# Patient Record
Sex: Male | Born: 1965 | Race: Black or African American | Hispanic: No | Marital: Married | Smoking: Current every day smoker
Health system: Southern US, Community
[De-identification: ages and names within clinical notes are randomized; demographics above are authoritative.]

## PROBLEM LIST (undated history)

## (undated) DIAGNOSIS — Z8739 Personal history of other diseases of the musculoskeletal system and connective tissue: Secondary | ICD-10-CM

## (undated) HISTORY — PX: HERNIA REPAIR: SHX51

---

## 2013-04-03 ENCOUNTER — Emergency Department: Payer: Self-pay | Admitting: Emergency Medicine

## 2013-04-03 LAB — CBC
HCT: 39.4 % — ABNORMAL LOW (ref 40.0–52.0)
HGB: 13.8 g/dL (ref 13.0–18.0)
MCH: 31.9 pg (ref 26.0–34.0)
MCV: 91 fL (ref 80–100)
Platelet: 213 10*3/uL (ref 150–440)
RDW: 13 % (ref 11.5–14.5)

## 2013-04-03 LAB — COMPREHENSIVE METABOLIC PANEL
Albumin: 3.9 g/dL (ref 3.4–5.0)
Alkaline Phosphatase: 74 U/L (ref 50–136)
BUN: 12 mg/dL (ref 7–18)
Bilirubin,Total: 0.4 mg/dL (ref 0.2–1.0)
Calcium, Total: 8.8 mg/dL (ref 8.5–10.1)
Chloride: 106 mmol/L (ref 98–107)
Co2: 26 mmol/L (ref 21–32)
Creatinine: 0.95 mg/dL (ref 0.60–1.30)
EGFR (Non-African Amer.): >60
Glucose: 102 mg/dL — ABNORMAL HIGH (ref 65–99)
Potassium: 3.6 mmol/L (ref 3.5–5.1)
SGPT (ALT): 23 U/L (ref 12–78)
Total Protein: 8.3 g/dL — ABNORMAL HIGH (ref 6.4–8.2)

## 2013-05-02 ENCOUNTER — Ambulatory Visit: Payer: Self-pay | Admitting: Surgery

## 2013-05-02 LAB — BASIC METABOLIC PANEL
Anion Gap: 3 — ABNORMAL LOW (ref 7–16)
BUN: 13 mg/dL (ref 7–18)
Calcium, Total: 8.9 mg/dL (ref 8.5–10.1)
Chloride: 107 mmol/L (ref 98–107)
Co2: 29 mmol/L (ref 21–32)
Creatinine: 0.86 mg/dL (ref 0.60–1.30)
EGFR (African American): >60
EGFR (Non-African Amer.): >60
Osmolality: 277 (ref 275–301)
Potassium: 3.9 mmol/L (ref 3.5–5.1)
Sodium: 139 mmol/L (ref 136–145)

## 2013-05-02 LAB — APTT: Activated PTT: 35.4 secs (ref 23.6–35.9)

## 2013-05-02 LAB — HEPATIC FUNCTION PANEL A (ARMC)
Alkaline Phosphatase: 67 U/L (ref 50–136)
Bilirubin, Direct: <0.1 mg/dL (ref 0.00–0.20)
Bilirubin,Total: 0.4 mg/dL (ref 0.2–1.0)
Total Protein: 8 g/dL (ref 6.4–8.2)

## 2013-05-02 LAB — CBC WITH DIFFERENTIAL/PLATELET
Basophil #: 0 10*3/uL (ref 0.0–0.1)
Eosinophil #: 0.1 10*3/uL (ref 0.0–0.7)
Eosinophil %: 2.4 %
Lymphocyte #: 1.4 10*3/uL (ref 1.0–3.6)
MCH: 31.3 pg (ref 26.0–34.0)
Monocyte #: 0.5 x10 3/mm (ref 0.2–1.0)
Monocyte %: 8.7 %
Neutrophil #: 3.6 10*3/uL (ref 1.4–6.5)
Neutrophil %: 64.1 %
Platelet: 231 10*3/uL (ref 150–440)
RDW: 13.3 % (ref 11.5–14.5)

## 2013-05-10 ENCOUNTER — Inpatient Hospital Stay: Payer: Self-pay | Admitting: Surgery

## 2013-05-11 LAB — CBC WITH DIFFERENTIAL/PLATELET
Basophil #: 0 10*3/uL (ref 0.0–0.1)
Basophil %: 0.2 %
Eosinophil #: 0 10*3/uL (ref 0.0–0.7)
HCT: 39.5 % — ABNORMAL LOW (ref 40.0–52.0)
Lymphocyte #: 0.6 10*3/uL — ABNORMAL LOW (ref 1.0–3.6)
Lymphocyte %: 5.2 %
Monocyte #: 1.3 x10 3/mm — ABNORMAL HIGH (ref 0.2–1.0)
Neutrophil #: 10.1 10*3/uL — ABNORMAL HIGH (ref 1.4–6.5)
Platelet: 219 10*3/uL (ref 150–440)
RBC: 4.26 10*6/uL — ABNORMAL LOW (ref 4.40–5.90)
RDW: 13.2 % (ref 11.5–14.5)
WBC: 12.1 10*3/uL — ABNORMAL HIGH (ref 3.8–10.6)

## 2013-05-11 LAB — COMPREHENSIVE METABOLIC PANEL
Alkaline Phosphatase: 49 U/L — ABNORMAL LOW (ref 50–136)
Bilirubin,Total: 0.5 mg/dL (ref 0.2–1.0)
Chloride: 105 mmol/L (ref 98–107)
Creatinine: 1.09 mg/dL (ref 0.60–1.30)
EGFR (African American): >60
Glucose: 140 mg/dL — ABNORMAL HIGH (ref 65–99)
Osmolality: 274 (ref 275–301)
Potassium: 5 mmol/L (ref 3.5–5.1)
SGOT(AST): 27 U/L (ref 15–37)
SGPT (ALT): 26 U/L (ref 12–78)
Sodium: 136 mmol/L (ref 136–145)
Total Protein: 7.2 g/dL (ref 6.4–8.2)

## 2014-12-14 NOTE — Op Note (Signed)
PATIENT NAME:  Carl Washington, Carl Washington MR#:  [id number] DATE OF BIRTH:  May 30, 1966  DATE OF PROCEDURE:  05/10/2013  OPERATION PERFORMED:   1.  Laparotomy.  2.  Partial omentectomy.  3.  Left orchiectomy.  4.  Incarcerated left inguinal hernia repair with mesh.   SURGEON: Claude MangesWilliam F Onyx Schirmer, M.D.   FIRST ASSISTANT: Dr. Westley Gamblesim Oaks.   ANESTHESIA: General.   PREOPERATIVE DIAGNOSIS: Huge incarcerated left inguinal hernia.   POSTOPERATIVE DIAGNOSIS: Huge incarcerated left inguinal hernia. (Sliding-entire sigmoid colon.)   PROCEDURE IN DETAIL: The patient was prepped and draped in the usual sterile fashion  in the supine position and a lower midline incision was made and carried just above the umbilicus and down through the subcutaneous tissue and the linea alba with electrocautery. The peritoneum was entered carefully. The patient was noted to have omentum that was incarcerated the left inguinal hernia sac and this could not be reduced and therefore, a longitudinal incision was made in the left hemiscrotum and this was carried down through the multiple layers of scar tissue and dartos fascia and 3 liters of hydrocele fluid was evacuated. Even with this exposure on both sides of the hernia defect, the omentum could not be reduced as it was wadded up and scarred down in the scrotum. Therefore, an omentectomy was performed with a combination of the Harmonic scalpel and 2-0 silk ligatures from the tip of the transverse colon which was essentially at the internal inguinal ring. Once this had been performed, the omentum was removed from within the scrotum. This left the patient with the entire sigmoid colon as a sliding component hernia down in the hernia sac and it was quite long and bulbous. Therefore, the entire hernia sac was excised.  The gubernaculum was divided. The left testicle and spermatic cord were elevated in the wound and ultimately dissected all the way up to the neck of the hernia sac. The portion of  the hernia sac that was next to the sigmoid colon was left attached to the sigmoid colon and a basically retroperitoneal dissection was performed within the scrotum such that the entire sigmoid mesocolon could be freed up to the inguinal ring. Inguinal ring was quite large but despite this I could not reduce the sigmoid colon. Therefore, I divided the left inguinal ligament and following that with bimanual manipulation I was able to reduce the sigmoid colon into the peritoneal cavity. After this, orchiectomy was performed by dividing the spermatic cord distal to suture ligature of 2-0 Vicryl and a double ligature of 2-0 Vicryl and this left the left hemiscrotum with a tremendous amount of raw surface area that some of which was oozing. This was all controlled with the argon beam coagulating device and a small incision was made in the dependent area of the left hemiscrotum and a 1/2 inch Penrose drain was brought through this into the scrotal cavity and secured to the skin with a 2-0 nylon suture. Attention was then turned to the peritoneal cavity where the hernia repair was undertaken first with an 11 x 14 Kugel Hernia Patch was placed in a preperitoneal or retroperitoneal position and secured in place with 2 horizontal mattress sutures of 2-0 Prolene. One of these was to the bilious psoas muscle and the other to the pubis. Despite this, the hernia defect was not closed adequately (dorsally) and therefore an additional 11 x 14 Ventrio Hernia Patch was placed in the peritoneal cavity, down below to the previously placed Kugel. This was also sutured in  place but just sutured to the peritoneum in order to keep the Ventrio Hernia Patch from holding back on itself and allowing any of the polypropylene to be exposed to the peritoneal cavity. This in fact, adequately closed the hernia defect and the sigmoid colon and mesocolon were replaced in their anatomic position and the small intestine was replaced in its anatomic  position and the transverse colon and mesocolon were draped over top of the small bowel and the linea alba was closed with a running #1 PDS suture. The subcutaneous tissue was irrigated and the skin was reapproximated with a skin stapling device. The left hemiscrotum was closed in 3 layers each with 3-0 Monocryl including the very thick-walled scar tissue that had been surrounding the hernia sac and dartos fascia and the skin. The patient tolerated the procedure well. There were no complications.     ____________________________ Claude Manges, MD wfm:dp D: 05/10/2013 14:48:38 ET T: 05/10/2013 15:59:51 ET JOB#: [id number]  cc: Claude Manges, MD, <Dictator> Claude Manges MD ELECTRONICALLY SIGNED 05/12/2013 18:01

## 2014-12-14 NOTE — Discharge Summary (Signed)
PATIENT NAME:  Carl [place], [place] MR#:  [id number] DATE OF BIRTH:  10-20-1965  DATE OF ADMISSION:  05/10/2013 DATE OF DISCHARGE:  05/13/2013   PRINCIPAL DIAGNOSIS: Huge incarcerated left inguinal hernia (sliding type of entire sigmoid colon).   OTHER DIAGNOSIS: Morbid obesity.   PRINCIPAL PROCEDURE PERFORMED DURING THIS ADMISSION: Laparotomy, partial omentectomy, left orchiectomy, incarcerated left inguinal hernia repair with mesh.   HOSPITAL COURSE: The patient was admitted to the hospital following the above-mentioned procedure for the above-mentioned diagnosis, and his pain slowly improved to where it was controlled with Tylox. He was eating, ambulating and having bowel movements. I removed his Penrose drain on the day of discharge, and he still had a huge scrotum due to the thickness of the wall and the chronicity of the disease. There was no appreciable change in his scrotum from directly postoperative, and therefore I do not believe that he had any significant hematoma or fluid collection. I instructed him and his wife to make sure his scrotum remains supported and that they would have to modify some plain underwear or perhaps boxer shorts, as it was still much larger than would fit into any kind of scrotal support system. I asked him to return to see me in 6 days and to call the office in the interim for fever or any other problems. He was discharged home with Tylox and a full month of ciprofloxacin.   ____________________________ Claude MangesWilliam F. Sinclair Arrazola, MD wfm:OSi D: 05/13/2013 09:07:27 ET T: 05/13/2013 09:47:03 ET JOB#: [id number]  cc: Claude MangesWilliam F. Shmuel Girgis, MD, <Dictator> Claude MangesWILLIAM F Aliannah Holstrom MD ELECTRONICALLY SIGNED 05/13/2013 16:41

## 2017-12-28 ENCOUNTER — Encounter: Payer: Self-pay | Admitting: Emergency Medicine

## 2017-12-28 ENCOUNTER — Other Ambulatory Visit: Payer: Self-pay

## 2017-12-28 ENCOUNTER — Ambulatory Visit
Admission: EM | Admit: 2017-12-28 | Discharge: 2017-12-28 | Disposition: A | Discharge status: Home / Self Care | Payer: BLUE CROSS/BLUE SHIELD | Attending: Family Medicine | Admitting: Family Medicine

## 2017-12-28 ENCOUNTER — Ambulatory Visit (INDEPENDENT_AMBULATORY_CARE_PROVIDER_SITE_OTHER): Payer: BLUE CROSS/BLUE SHIELD

## 2017-12-28 DIAGNOSIS — M778 Other enthesopathies, not elsewhere classified: Secondary | ICD-10-CM

## 2017-12-28 DIAGNOSIS — M779 Enthesopathy, unspecified: Secondary | ICD-10-CM | POA: Diagnosis not present

## 2017-12-28 DIAGNOSIS — M25532 Pain in left wrist: Secondary | ICD-10-CM

## 2017-12-28 MED ORDER — PREDNISONE 20 MG PO TABS: ORAL_TABLET | ORAL | 0 refills | Status: DC

## 2017-12-28 MED ORDER — HYDROCODONE-ACETAMINOPHEN 5-325 MG PO TABS: ORAL_TABLET | ORAL | 0 refills | Status: DC

## 2017-12-28 MED ORDER — ACETAMINOPHEN 500 MG PO TABS
1000.0000 mg | ORAL_TABLET | Freq: Once | ORAL | Status: AC
  Administered 2017-12-28: 1000 mg via ORAL

## 2017-12-28 NOTE — ED Provider Notes (Signed)
[facility]    CSN: [id number] Arrival date & time: 12/28/17  4098     History   Chief Complaint Chief Complaint  Patient presents with  . Wrist Pain    left    HPI Carl Washington is a 52 y.o. male.   52 yo male with a c/o right wrist pain and swelling for 2 days. Denies any direct trauma or injuries, fevers, chills, rash.   The history is provided by the patient.  Wrist Pain     History reviewed. No pertinent past medical history.  There are no active problems to display for this patient.   Past Surgical History:  Procedure Laterality Date  . HERNIA REPAIR         Home Medications    Prior to Admission medications   Medication Sig Start Date End Date Taking? Authorizing Provider  HYDROcodone-acetaminophen (NORCO/VICODIN) 5-325 MG tablet 1-2 tabs po qd prn 12/28/17   Payton Mccallum, MD  predniSONE (DELTASONE) 20 MG tablet 3 tabs po once day 1, then 2 tabs po qd x 2 days, then 1 tab po qd x 2 days, then half a tab po qd x 2 days 12/28/17   Payton Mccallum, MD    Family History History reviewed. No pertinent family history.  Social History Social History   Tobacco Use  . Smoking status: Current Every Day Smoker    Types: Cigarettes  . Smokeless tobacco: Never Used  Substance Use Topics  . Alcohol use: Never    Frequency: Never  . Drug use: Not on file     Allergies   Patient has no known allergies.   Review of Systems Review of Systems   Physical Exam Triage Vital Signs ED Triage Vitals  Enc Vitals Group     BP 12/28/17 0900 (S) (!) 174/84     Pulse Rate 12/28/17 0900 74     Resp 12/28/17 0900 16     Temp 12/28/17 0900 98.4 F (36.9 C)     Temp Source 12/28/17 0900 Oral     SpO2 12/28/17 0900 97 %     Weight 12/28/17 0857 255 lb (115.7 kg)     Height 12/28/17 0857  (1.803 m)     Head Circumference --      Peak Flow --      Pain Score 12/28/17 0857 8     Pain Loc --      Pain Edu? --      Excl. in GC? --    No  data found.  Updated Vital Signs BP (S) (!) 174/84 (BP Location: Right Arm)   Pulse 74   Temp 98.4 F (36.9 C) (Oral)   Resp 16   Ht  (1.803 m)   Wt 255 lb (115.7 kg)   SpO2 97%   BMI 35.57 kg/m   Visual Acuity Right Eye Distance:   Left Eye Distance:   Bilateral Distance:    Right Eye Near:   Left Eye Near:    Bilateral Near:     Physical Exam  Constitutional: He appears well-developed and well-nourished. No distress.  Musculoskeletal:       Left wrist: He exhibits decreased range of motion, tenderness, bony tenderness and swelling. He exhibits no effusion, no crepitus, no deformity and no laceration.  Skin: He is not diaphoretic.  Nursing note and vitals reviewed.    UC Treatments / Results  Labs (all labs ordered are listed, but only abnormal results are displayed) Labs  Reviewed - No data to display  EKG None  Radiology Dg Wrist Complete Left  Result Date: 12/28/2017 CLINICAL DATA:  Atraumatic swelling pain at the left wrist. EXAM: LEFT WRIST - COMPLETE 3+ VIEW COMPARISON:  None. FINDINGS: Soft tissue swelling about the wrist without fracture, malalignment, erosion, joint narrowing or soft tissue calcification. IMPRESSION: Soft tissue swelling without osseous abnormality. Electronically Signed   By: Marnee Spring M.D.   On: 12/28/2017 10:39    Procedures Procedures (including critical care time)  Medications Ordered in UC Medications  acetaminophen (TYLENOL) tablet 1,000 mg (1,000 mg Oral Given 12/28/17 0943)    Initial Impression / Assessment and Plan / UC Course  I have reviewed the triage vital signs and the nursing notes.  Pertinent labs & imaging results that were available during my care of the patient were reviewed by me and considered in my medical decision making (see chart for details).      Final Clinical Impressions(s) / UC Diagnoses   Final diagnoses:  Left wrist pain  Left wrist tendonitis     Discharge Instructions       Ice, rest    ED Prescriptions    Medication Sig Dispense Auth. Provider   predniSONE (DELTASONE) 20 MG tablet 3 tabs po once day 1, then 2 tabs po qd x 2 days, then 1 tab po qd x 2 days, then half a tab po qd x 2 days 10 tablet Payton Mccallum, MD   HYDROcodone-acetaminophen (NORCO/VICODIN) 5-325 MG tablet 1-2 tabs po qd prn 6 tablet Shamiah Kahler, Pamala Hurry, MD     1. x-ray results and diagnosis reviewed with patient 2. rx as per orders above; reviewed possible side effects, interactions, risks and benefits  3. Recommend supportive treatment with ice, rest 4. Follow-up prn if symptoms worsen or Washington't improve     Controlled Substance Prescriptions [place] Controlled Substance Registry consulted? Not Applicable   Payton Mccallum, MD 12/28/17 1126

## 2017-12-28 NOTE — Discharge Instructions (Signed)
Ice, rest

## 2017-12-28 NOTE — ED Triage Notes (Signed)
Patient c/o left wrist pain and swelling that started yesterday.  Patient denies injury or fall.

## 2017-12-30 ENCOUNTER — Other Ambulatory Visit: Payer: Self-pay

## 2017-12-30 ENCOUNTER — Encounter: Payer: Self-pay | Admitting: Emergency Medicine

## 2017-12-30 ENCOUNTER — Ambulatory Visit
Admission: EM | Admit: 2017-12-30 | Discharge: 2017-12-30 | Disposition: A | Discharge status: Home / Self Care | Payer: BLUE CROSS/BLUE SHIELD | Attending: Family Medicine | Admitting: Family Medicine

## 2017-12-30 DIAGNOSIS — M25532 Pain in left wrist: Secondary | ICD-10-CM

## 2017-12-30 MED ORDER — COLCHICINE 0.6 MG PO TABS: 0.6000 mg | ORAL_TABLET | Freq: Two times a day (BID) | ORAL | 0 refills | Status: DC

## 2017-12-30 NOTE — Discharge Instructions (Signed)
Suspect gout.  Continue the prednisone. Take the colchicine as well.  Take care  Dr. Adriana Simas

## 2017-12-30 NOTE — ED Triage Notes (Signed)
Patient states he is not much better. States he has been icing it and taking the medicine but it is still painful and swollen

## 2017-12-30 NOTE — ED Provider Notes (Signed)
[facility]    CSN: [id number] Arrival date & time: 12/30/17  1531  History   Chief Complaint Chief Complaint  Patient presents with  . Joint Swelling    HPI   52 year old male presents with continued left wrist pain and swelling.  Patient states that this started on the last day that he was seen which was 5/7.  He had an x-ray done which was negative.  He states that he continues to have swelling and pain as well as decreased range of motion.  He states that is slightly improved.  He is currently taking prednisone and is using pain medication as needed.  No reports of fall, trauma, injury.  Patient's primary concern is his return to work.  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  Worse with range of motion.  No other associated symptoms.  No other complaints or concerns at this time.  Social History Social History   Tobacco Use  . Smoking status: Current Every Day Smoker    Types: Cigarettes  . Smokeless tobacco: Never Used  Substance Use Topics  . Alcohol use: Never    Frequency: Never  . Drug use: Not on file   Allergies   Patient has no known allergies.  Review of Systems Review of Systems  Constitutional: Negative.   Musculoskeletal:       Wrist pain and swelling.   Physical Exam Triage Vital Signs ED Triage Vitals  Enc Vitals Group     BP 12/30/17 1551 (!) 150/86     Pulse Rate 12/30/17 1551 83     Resp 12/30/17 1551 16     Temp 12/30/17 1551 98 F (36.7 C)     Temp src --      SpO2 12/30/17 1551 99 %     Weight 12/30/17 1550 255 lb (115.7 kg)     Height 12/30/17 1550  (1.803 m)     Head Circumference --      Peak Flow --      Pain Score 12/30/17 1549 7     Pain Loc --      Pain Edu? --      Excl. in GC? --    Updated Vital Signs BP (!) 150/86 (BP Location: Left Arm)   Pulse 83   Temp 98 F (36.7 C)   Resp 16   Ht   (1.803 m)   Wt 255 lb (115.7 kg)   SpO2 99%   BMI 35.57 kg/m    Physical Exam  Constitutional: He is oriented to person, place, and time. He appears well-developed. No distress.  HENT:  Head: Normocephalic and atraumatic.  Pulmonary/Chest: Effort normal. No respiratory distress.  Musculoskeletal:  Left wrist -warmth.  No appreciable erythema.  Exquisitely tender to palpation.  Markedly decreased range of motion in all planes.  Neurological: He is alert and oriented to person, place, and time.  Psychiatric: He has a normal mood and affect. His behavior is normal.  Nursing note and vitals reviewed.  UC Treatments / Results  Labs (all labs ordered are listed, but only abnormal results are displayed) Labs Reviewed - No data to display  EKG None  Radiology No results found.  Procedures Procedures (including critical care time)  Medications Ordered in UC Medications - No data to display  Initial Impression / Assessment and  Plan / UC Course  I have reviewed the triage vital signs and the nursing notes.  Pertinent labs & imaging results that were available during my care of the patient were reviewed by me and considered in my medical decision making (see chart for details).    52 year old male presents with continued wrist pain.  Work note given.  I suspect that this is gout.  Continue prednisone.  Adding colchicine.  Final Clinical Impressions(s) / UC Diagnoses   Final diagnoses:  Left wrist pain     Discharge Instructions     Suspect gout.  Continue the prednisone. Take the colchicine as well.  Take care  Dr. Adriana Simas    ED Prescriptions    Medication Sig Dispense Auth. Provider   colchicine 0.6 MG tablet Take 1 tablet (0.6 mg total) by mouth 2 (two) times daily. 20 tablet Tommie Sams, DO     Controlled Substance Prescriptions [place] Controlled Substance Registry consulted? Not Applicable   Tommie Sams, DO 12/30/17 1643

## 2020-01-30 ENCOUNTER — Ambulatory Visit
Admission: EM | Admit: 2020-01-30 | Discharge: 2020-01-30 | Disposition: A | Discharge status: Home / Self Care | Payer: BC Managed Care – PPO | Attending: Emergency Medicine | Admitting: Emergency Medicine

## 2020-01-30 ENCOUNTER — Encounter: Payer: Self-pay | Admitting: Emergency Medicine

## 2020-01-30 ENCOUNTER — Other Ambulatory Visit: Payer: Self-pay

## 2020-01-30 DIAGNOSIS — S63502A Unspecified sprain of left wrist, initial encounter: Secondary | ICD-10-CM | POA: Diagnosis not present

## 2020-01-30 DIAGNOSIS — M109 Gout, unspecified: Secondary | ICD-10-CM

## 2020-01-30 HISTORY — DX: Personal history of other diseases of the musculoskeletal system and connective tissue: Z87.39

## 2020-01-30 MED ORDER — INDOMETHACIN 25 MG PO CAPS
50.0000 mg | ORAL_CAPSULE | Freq: Three times a day (TID) | ORAL | 0 refills | Status: AC | PRN
Start: 2020-01-30 — End: 2020-02-04

## 2020-01-30 MED ORDER — PREDNISONE 20 MG PO TABS
40.0000 mg | ORAL_TABLET | Freq: Every day | ORAL | 0 refills | Status: AC
Start: 2020-01-30 — End: 2020-02-04

## 2020-01-30 MED ORDER — COLCHICINE 0.6 MG PO TABS
ORAL_TABLET | ORAL | 0 refills | Status: DC
Start: 2020-01-30 — End: 2020-06-17

## 2020-01-30 MED ORDER — INDOMETHACIN 25 MG PO CAPS
25.0000 mg | ORAL_CAPSULE | Freq: Three times a day (TID) | ORAL | 0 refills | Status: DC | PRN
Start: 2020-01-30 — End: 2020-01-30

## 2020-01-30 NOTE — ED Provider Notes (Signed)
HPI  SUBJECTIVE:  Carl Washington is a left-handed 54 y.o. male who presents with throbbing constant left wrist pain, swelling starting yesterday.  He reports hypersensitivity to touch for the past few days.  No body aches, fevers, joint erythema.  No trauma to the wrist.  No numbness or tingling in his fingers.  States that his hand, forearm and elbow are without injury.  No recent red meat, seafood ingestion.  He tried aspirin without improvement in his symptoms.  No alleviating factors.  Symptoms worse with use, palpation.  He took aspirin within 4 to 6 hours of evaluation.  He states this is identical to gout which she gets in this joint.  States that he was seen here and given a wrist brace which helped.  He has a past medical history of gout in the left wrist, he is not on any preventative medications.  No history of diabetes, hypertension, kidney disease, carpal tunnel syndrome, history of injury to the left wrist, septic joint.  PMD: None.  Patient was seen here twice in 2019 for an identical presentation, thought to have gout.  Was sent home with prednisone, Norco for the first visit and colchicine on the second visit.  Past Medical History:  Diagnosis Date  . Personal history of gout     Past Surgical History:  Procedure Laterality Date  . HERNIA REPAIR      Family History  Problem Relation Age of Onset  . Diabetes Mother   . Other Father        unknown medical history    Social History   Tobacco Use  . Smoking status: Current Every Day Smoker    Packs/day: 0.75    Years: 2.00    Pack years: 1.50    Types: Cigarettes  . Smokeless tobacco: Never Used  Substance Use Topics  . Alcohol use: Yes    Comment: social  . Drug use: Never    No current facility-administered medications for this encounter.  Current Outpatient Medications:  .  colchicine 0.6 MG tablet, 2 tabs po x 1, then one tab po 1 hour later, Disp: 6 tablet, Rfl: 0 .  indomethacin (INDOCIN) 25 MG  capsule, Take 2 capsules (50 mg total) by mouth 3 (three) times daily as needed for up to 5 days., Disp: 30 capsule, Rfl: 0 .  predniSONE (DELTASONE) 20 MG tablet, Take 2 tablets (40 mg total) by mouth daily with breakfast for 5 days., Disp: 10 tablet, Rfl: 0  No Known Allergies   ROS  As noted in HPI.   Physical Exam  BP (!) 168/81 (BP Location: Right Arm)   Pulse 83   Temp 98.2 F (36.8 C) (Oral)   Resp 18   Ht 5\' 11"  (1.803 m)   Wt 127 kg   SpO2 100%   BMI 39.05 kg/m   Constitutional: Well developed, well nourished, no acute distress Eyes:  EOMI, conjunctiva normal bilaterally HENT: Normocephalic, atraumatic,mucus membranes moist Respiratory: Normal inspiratory effort Cardiovascular: Normal rate GI: nondistended skin: No rash, skin intact Musculoskeletal: Positive tender swelling left wrist.  No erythema, increased temperature.  L  distal radius NT, distal ulnar styloid NT, snuffbox NT, carpals NT , metacarpals NT, digits NT, TFCC NT .  pain  with supination,  Pain with pronation, pain  with wrist flexion extension, radial / ulnar deviation. Motor intact ability to flex / extend digits of affected hand, Sensation LT to hand normal, CR<2 seconds distally, Elbow and proximal forearm NT.  Neurologic: Alert & oriented x 3, no focal neuro deficits Psychiatric: Speech and behavior appropriate   ED Course   Medications - No data to display  Orders Placed This Encounter  Procedures  . Apply Wrist brace    Standing Status:   Standing    Number of Occurrences:   1    Order Specific Question:   Laterality    Answer:   Left    No results found for this or any previous visit (from the past 24 hour(s)). No results found.  ED Clinical Impression  1. Acute gout of left wrist, unspecified cause      ED Assessment/Plan  Previous records reviewed.  As noted in HPI.  Suspect recurrent gout.  Doubt septic joint.  Patient states that this is identical to  previous flares of gout.  He denies trauma, so imaging was deferred.  Home with indomethacin, colchicine, prednisone.  Patient states the left wrist brace helped him last time this happened, will so we will send him home with another one.  Work note for 2 days.  Primary care list for ongoing management.  Blood pressure noted.  Discussed with patient that he needs to have this monitored.  Discussed MDM, treatment plan, and plan for follow-up with patient. Discussed sn/sx that should prompt return to the ED. patient agrees with plan.   Meds ordered this encounter  Medications  . colchicine 0.6 MG tablet    Sig: 2 tabs po x 1, then one tab po 1 hour later    Dispense:  6 tablet    Refill:  0  . predniSONE (DELTASONE) 20 MG tablet    Sig: Take 2 tablets (40 mg total) by mouth daily with breakfast for 5 days.    Dispense:  10 tablet    Refill:  0  . DISCONTD: indomethacin (INDOCIN) 25 MG capsule    Sig: Take 1 capsule (25 mg total) by mouth 3 (three) times daily as needed for up to 5 days.    Dispense:  15 capsule    Refill:  0  . indomethacin (INDOCIN) 25 MG capsule    Sig: Take 2 capsules (50 mg total) by mouth 3 (three) times daily as needed for up to 5 days.    Dispense:  30 capsule    Refill:  0    *This clinic note was created using [employer]. Therefore, there may be occasional mistakes despite careful proofreading.   ?    Melynda Ripple, MD 01/30/20 1052

## 2020-01-30 NOTE — Discharge Instructions (Signed)
Here is a list of primary care providers who are taking new patients:  Dr. Elizabeth Sauer [address] [place] [address] [phone]  [facility] [address] [place] [address]  [phone]  [facility] [address] [place], [place] [address] [phone]  [facility] [address] [place]  [phone] [place], [place] [address]  Here are clinics/ other resources who will see you if you do not have insurance. Some have certain criteria that you must meet. Call them and find out what they are:  [facility]: [address] [place], [place] [address] Phone: [phone] Hours: First and Third Saturdays of each Month, 9 a.m. - 1 p.m.  [facility]: [address], [address] [place], [place] [address] Phone: [phone] Hours: Tuesday, 4 p.m. - 8 p.m. Thursday, 1 p.m. - 8 p.m. Wednesday, 9 a.m. - [facility] [address] [place], [place] [address] Phone: [phone] Pharmacy Phone Number: [phone] Dental Phone Number: [phone] [facility] Insurance Help: [phone]  Dental Hours: Monday - Thursday, 8 a.m. - 6 p.m.  [facility] [address] [place], [place] [address] Phone: [phone] Pharmacy Phone Number: [phone] [facility] Insurance Help: [phone]  [facility] [address] [place] [place], [place] [address] Phone: [phone] Pharmacy Phone Number: [phone] [facility] Insurance Help: [phone]  [facility] [address] [place], [place] [address] Phone: [phone] [facility] Insurance Help: [phone]   [facility] [address], [place], [place] [address] Phone: [phone]  Go to www.goodrx.com to look up your medications. This will give you a list of where you can find your prescriptions at the most affordable prices. Or ask the pharmacist what the cash price is, or if they have  any other discount programs available to help make your medication more affordable. This can be less expensive than what you would pay with insurance.    Go to www.goodrx.com to look up your medications. This will give you a list of where you can find your prescriptions at the most affordable prices.   Dietary treatment plays a supplementary role in treatment of gout.  Diet can be expected to decrease the uric acid level by 10 to 15%.  Often medication can effect a much more substantial reduction.  An extremely restrictive diet is not necessary, but here are a few suggestions that might help decrease the frequency of gout attacks.  The first and most important measure is to achieve and maintain a healthy body weight (BMI of 20 to 25).  It's been shown that people with a BMI over 25 have an increased risk of gout attacks compared to people with a BMI of less than 25.  Also, gout patients who lose as little as 4.5 kg or 9.9 lbs will decrease their risk of gout attacks.  Beyond that, here are a few general guidelines:  Eat less: Red meat Seafood Beer and hard alcohol (e.g. gin, vodka, whiskey) Foods that contain high fructose corn syrup (found in sweets and non-diet sodas) Organ meats (liver, kidneys, brains, sweetbreads) or foods made from these meats (hot dogs, bologna).  Eat more: Low fat dairy products A moderate amount of wine (up to two 5 oz servings per day) is not likely to increase the risk of a gout attack. Coffee may decrease the risk of gout attacks Vitamin C (500 mg per day) has a mild urate lowering effect

## 2020-01-30 NOTE — ED Triage Notes (Signed)
Patient in today c/o left wrist pain and swelling x yesterday. Patient denies any injury. Patient does have a history of gout.

## 2020-02-20 IMAGING — CR DG WRIST COMPLETE 3+V*L*
4 series · 4 of 4 positions shown · non-contrast
Comparison: None.

CLINICAL DATA: Atraumatic swelling pain at the left wrist.

EXAM:
LEFT WRIST - COMPLETE 3+ VIEW

[wrist pa]
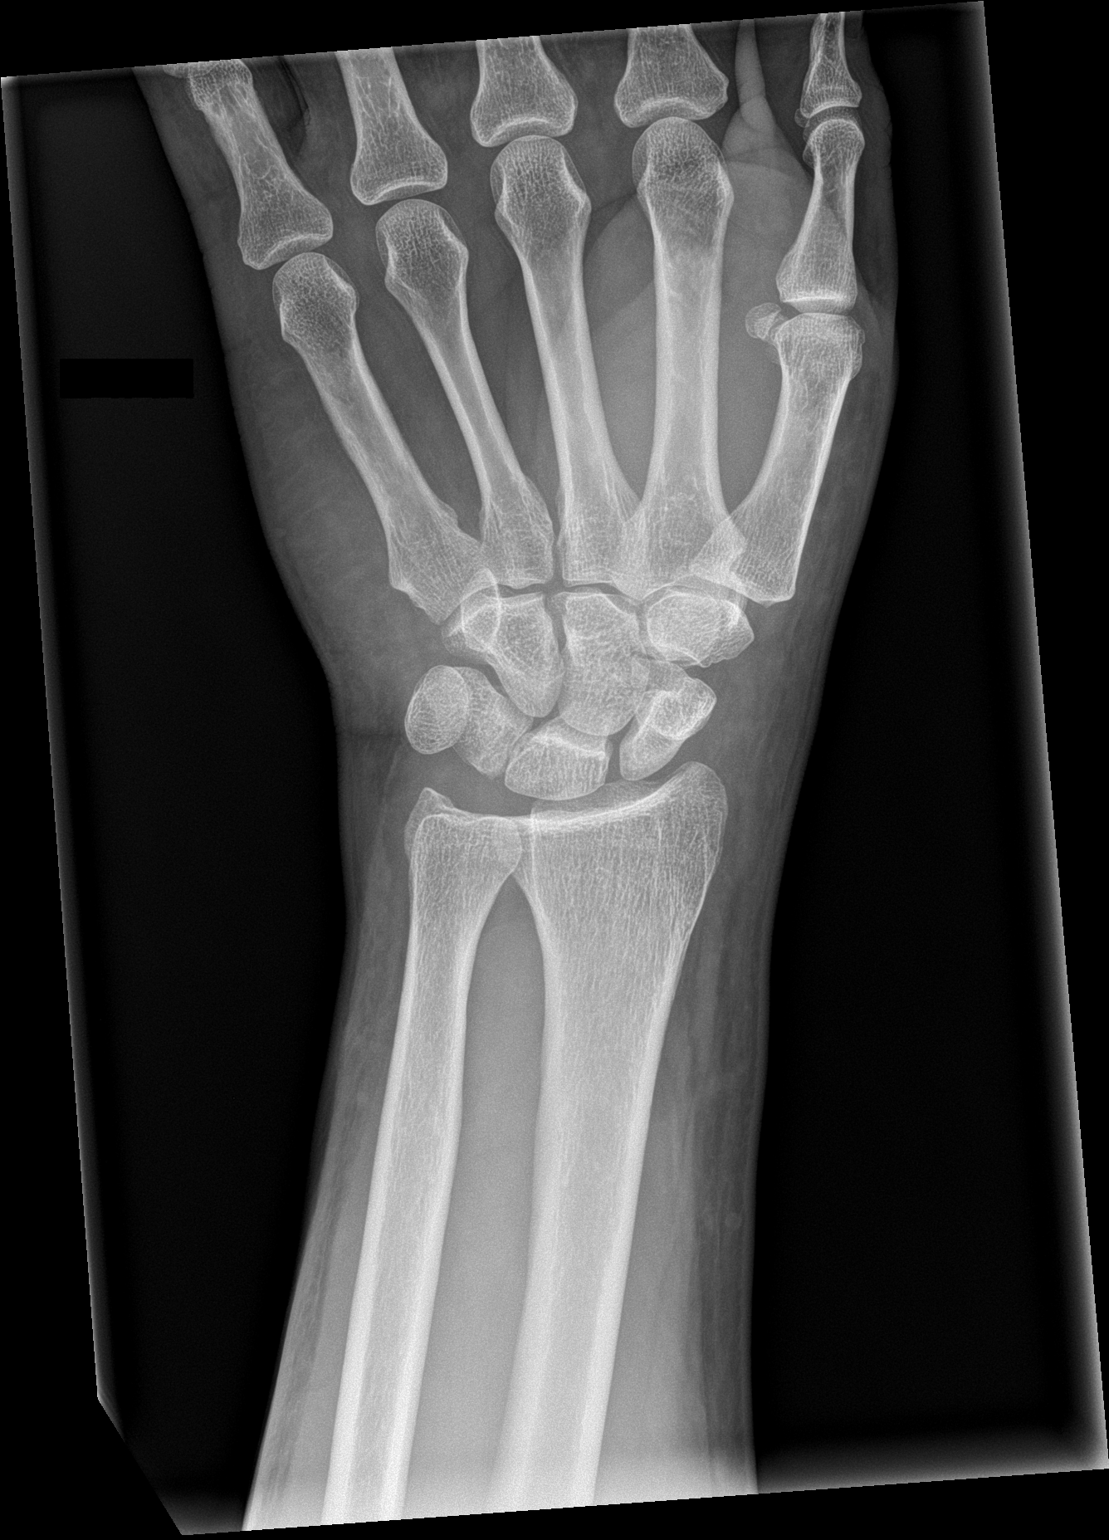

[wrist obl]
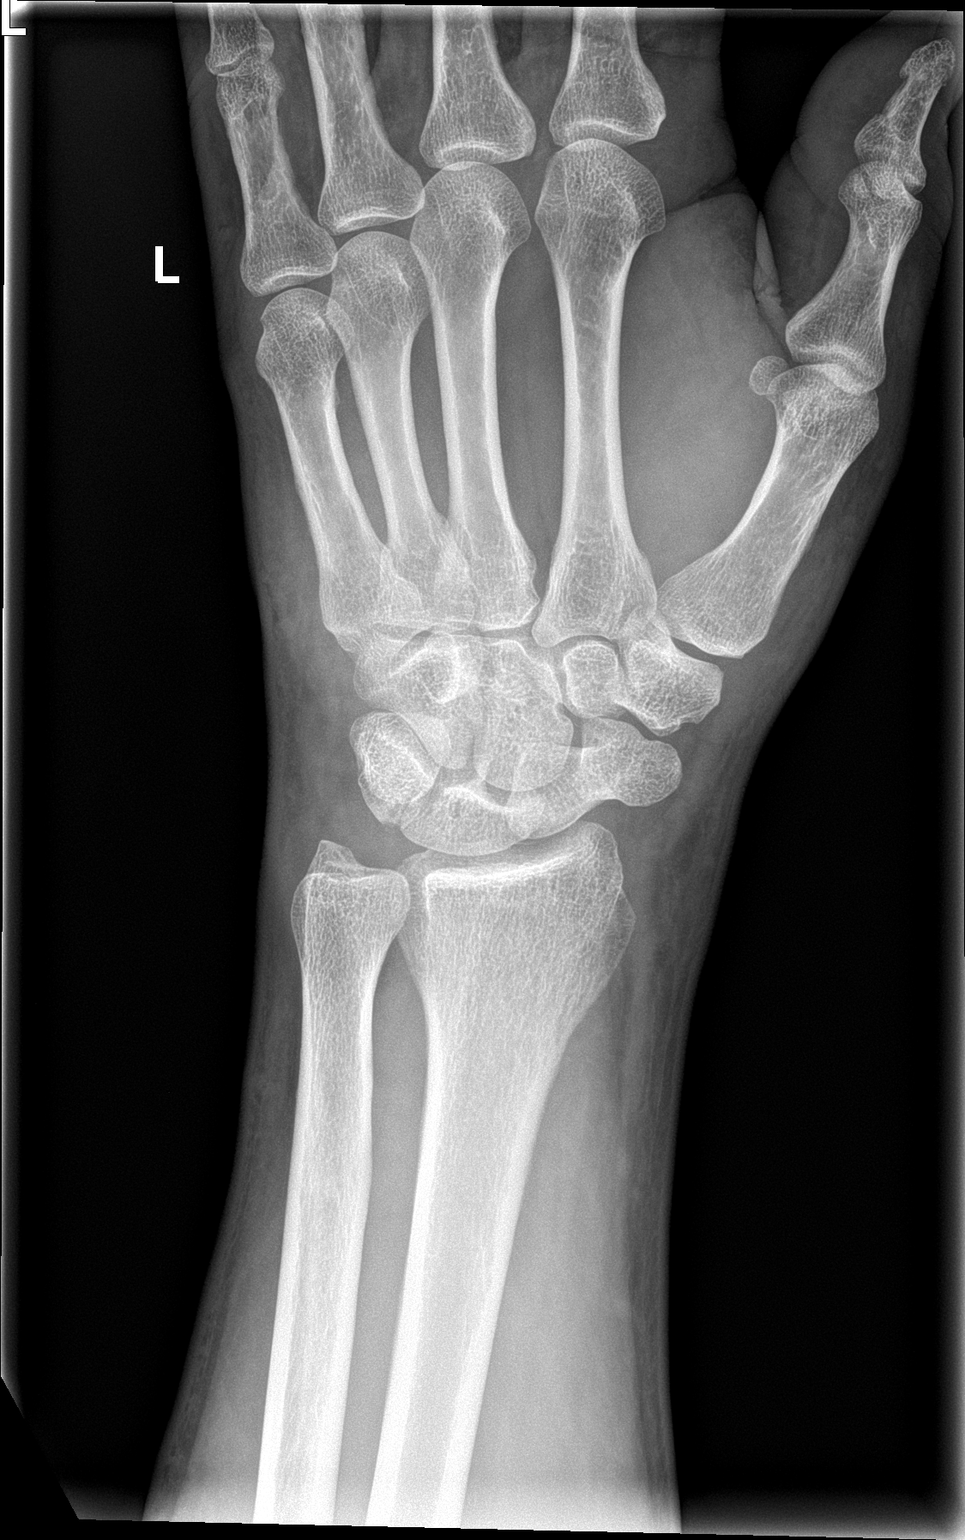

[wrist lat]
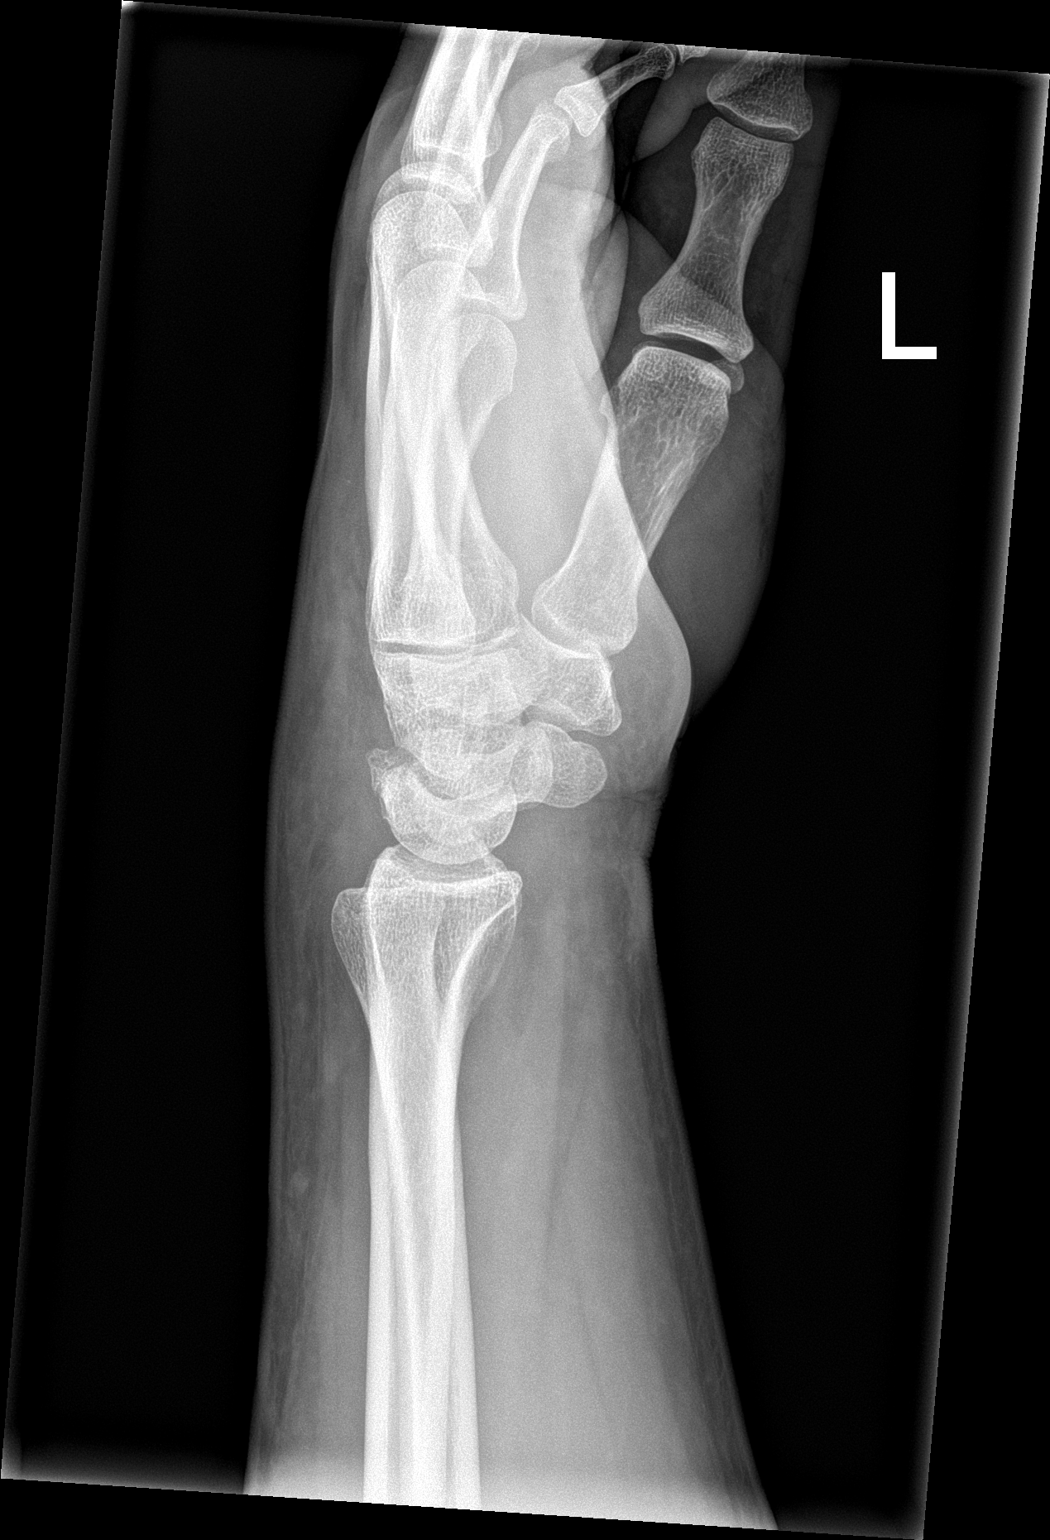

[wrist navicular]
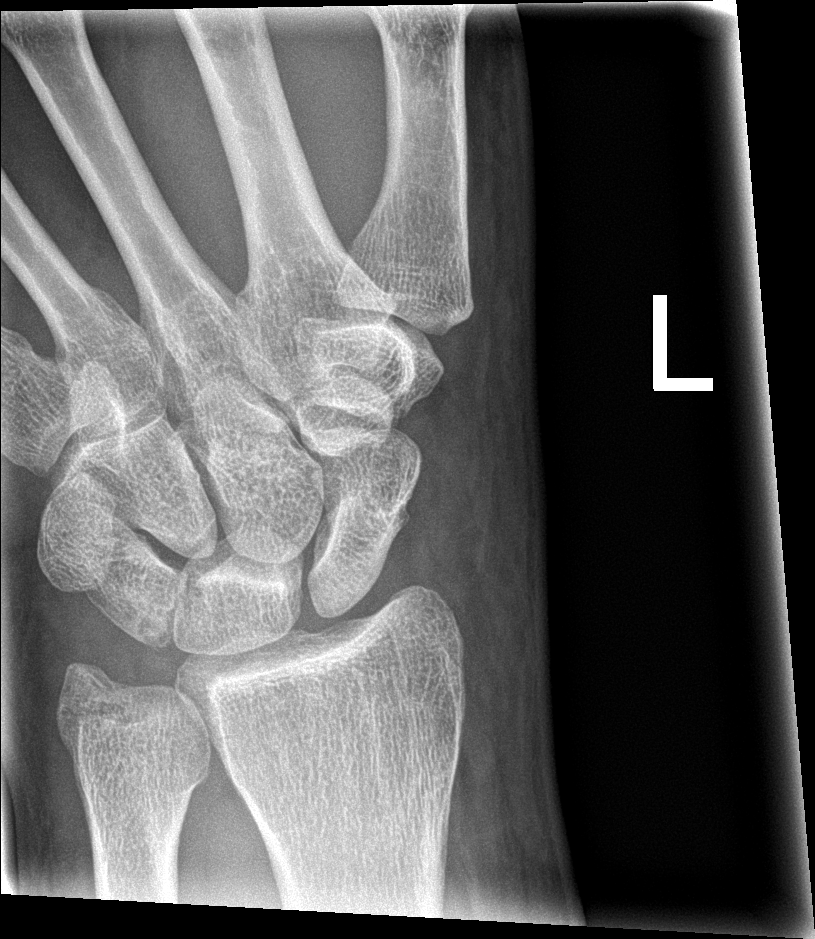

[4 of 4 positions shown; findings below may reference images not displayed]

FINDINGS: Soft tissue swelling about the wrist without fracture, malalignment,
erosion, joint narrowing or soft tissue calcification.
IMPRESSION: Soft tissue swelling without osseous abnormality.

## 2020-06-17 ENCOUNTER — Encounter: Payer: Self-pay | Admitting: Emergency Medicine

## 2020-06-17 ENCOUNTER — Ambulatory Visit
Admission: EM | Admit: 2020-06-17 | Discharge: 2020-06-17 | Disposition: A | Discharge status: Home / Self Care | Payer: BC Managed Care – PPO | Attending: Emergency Medicine | Admitting: Emergency Medicine

## 2020-06-17 ENCOUNTER — Other Ambulatory Visit: Payer: Self-pay

## 2020-06-17 DIAGNOSIS — I10 Essential (primary) hypertension: Secondary | ICD-10-CM | POA: Diagnosis not present

## 2020-06-17 DIAGNOSIS — M109 Gout, unspecified: Secondary | ICD-10-CM | POA: Insufficient documentation

## 2020-06-17 LAB — BASIC METABOLIC PANEL
Anion gap: 9 (ref 5–15)
BUN: 17 mg/dL (ref 6–20)
CO2: 25 mmol/L (ref 22–32)
Calcium: 8.8 mg/dL — ABNORMAL LOW (ref 8.9–10.3)
Chloride: 104 mmol/L (ref 98–111)
Creatinine, Ser: 0.9 mg/dL (ref 0.61–1.24)
GFR, Estimated: >60 mL/min (ref >60)
Glucose, Bld: 100 mg/dL — ABNORMAL HIGH (ref 70–99)
Potassium: 3.9 mmol/L (ref 3.5–5.1)
Sodium: 138 mmol/L (ref 135–145)

## 2020-06-17 MED ORDER — INDOMETHACIN 25 MG PO CAPS: 50.0000 mg | ORAL_CAPSULE | Freq: Three times a day (TID) | ORAL | 0 refills | Status: AC | PRN

## 2020-06-17 MED ORDER — PREDNISONE 20 MG PO TABS
40.0000 mg | ORAL_TABLET | Freq: Every day | ORAL | 0 refills | Status: AC
Start: 2020-06-17 — End: 2020-06-22

## 2020-06-17 MED ORDER — COLCHICINE 0.6 MG PO TABS
ORAL_TABLET | ORAL | 2 refills | Status: DC
Start: 2020-06-17 — End: 2022-12-01

## 2020-06-17 MED ORDER — AMLODIPINE BESYLATE 5 MG PO TABS
5.0000 mg | ORAL_TABLET | Freq: Every day | ORAL | 0 refills | Status: DC
Start: 2020-06-17 — End: 2022-12-01

## 2020-06-17 NOTE — ED Triage Notes (Signed)
Patient c/o pain in wrist that started yesterday that he believes is gout. Patient has a history of gout.

## 2020-06-17 NOTE — Discharge Instructions (Addendum)
I will call you if your blood work comes back abnormal.  If you do not hear from me by the end of the day, you can call here to get your results.  If you do not hear from me by tomorrow, then you can assume that everything is normal.  Decrease your salt intake. diet and exercise will lower your blood pressure significantly. It is important to keep your blood pressure under good control, as having a elevated blood pressure for prolonged periods of time significantly increases your risk of stroke, heart attacks, kidney damage, eye damage, and other problems. Measure your blood pressure once a day, preferably at the same time every day. Keep a log of this and bring it to your next doctor's appointment.  Bring your blood pressure cuff as well.  Return here in 2 weeks for blood pressure recheck if you're unable to find a primary care physician by then. Return immediately to the ER if you start having chest pain, headache, problems seeing, problems talking, problems walking, if you feel like you're about to pass out, if you do pass out, if you have a seizure, or for any other concerns.  Go to www.goodrx.com to look up your medications. This will give you a list of where you can find your prescriptions at the most affordable prices. Or ask the pharmacist what the cash price is, or if they have any other discount programs available to help make your medication more affordable. This can be less expensive than what you would pay with insurance.

## 2020-06-17 NOTE — ED Provider Notes (Signed)
HPI  SUBJECTIVE:  Carl Washington is a left-handed 54 y.o. male who presents with atraumatic left wrist pain, swelling starting yesterday.  He reports constant soreness, decreased range of motion.  States this is identical to the previous three episodes of left wrist pain which has been thought to be gout and has responded well to gout treatment.  States that he does not have any more refills on his gout medication.  No body aches, fevers, preceding hypersensitivity, trauma, overuse, numbness or tingling in his fingers, grip weakness.  No recent seafood, red meat, alcohol ingestion.  No erythema.  No antipyretic in the past 6 hours.  He has tried a wrist brace, Tylenol.  No alleviating factors.  Symptoms are worse with palpation, wrist movement.  He is a [employer].  Patient has been seen here 3 times since 2019 for left wrist pain, was thought to have tendinitis at the first time, sent home with prednisone, Norco.  He returned 2 days later and was thought to have gout.  He was sent home with colchicine.,He  Was seen here on 6/8 for the same, was sent home with indomethacin, colchicine, prednisone and a wrist brace.  Patient has a past medical history of gout and smokes cigars.  No formal diagnosis of hypertension does not take any medications for it, no history of chronic kidney disease, diabetes.  PMD: None.  Past Medical History:  Diagnosis Date  . Personal history of gout     Past Surgical History:  Procedure Laterality Date  . HERNIA REPAIR      Family History  Problem Relation Age of Onset  . Diabetes Mother   . Other Father        unknown medical history    Social History   Tobacco Use  . Smoking status: Current Every Day Smoker    Packs/day: 0.75    Years: 2.00    Pack years: 1.50    Types: Cigarettes  . Smokeless tobacco: Never Used  Vaping Use  . Vaping Use: Never used  Substance Use Topics  . Alcohol use: Yes    Comment: social  . Drug use: Never    No  current facility-administered medications for this encounter.  Current Outpatient Medications:  .  amLODipine (NORVASC) 5 MG tablet, Take 1 tablet (5 mg total) by mouth daily., Disp: 30 tablet, Rfl: 0 .  colchicine 0.6 MG tablet, 2 tabs po x 1, then one tab po 1 hour later, Disp: 6 tablet, Rfl: 2 .  indomethacin (INDOCIN) 25 MG capsule, Take 2 capsules (50 mg total) by mouth 3 (three) times daily as needed for up to 5 days., Disp: 30 capsule, Rfl: 0 .  predniSONE (DELTASONE) 20 MG tablet, Take 2 tablets (40 mg total) by mouth daily with breakfast for 5 days., Disp: 10 tablet, Rfl: 0  No Known Allergies   ROS  As noted in HPI.   Physical Exam  BP (!) 168/83 (BP Location: Right Arm)   Pulse 81   Temp 98 F (36.7 C) (Oral)   Resp 18   Ht 5\' 11"  (1.803 m)   Wt 120.2 kg   SpO2 99%   BMI 36.96 kg/m   Constitutional: Well developed, well nourished, no acute distress Eyes:  EOMI, conjunctiva normal bilaterally HENT: Normocephalic, atraumatic,mucus membranes moist Respiratory: Normal inspiratory effort Cardiovascular: Normal rate GI: nondistended skin: No rash, skin intact Musculoskeletal: Swelling wrist, dorsum of left hand. tenderness dorsum left mid wrist, TFCC.  No erythema, increased temperature.  Limited range of motion due to pain.  L  distal radius NT , distal ulnar styloid NT , snuffbox NT, carpals NT, metacarpals NT, digits NT.   pain  with ulnar deviation, wrist flexion.  No pain with radial deviation, wrist extension. Motor intact ability to flex / extend digits of affected hand, Sensation LT to hand grossly normal.  RP 2+.  No Tenderness along the APB/APB.  Shoulder and upper arm NT, Elbow and proximal forearm NT.      Neurologic: Alert & oriented x 3, no focal neuro deficits Psychiatric: Speech and behavior appropriate   ED Course   Medications - No data to display  Orders Placed This Encounter  Procedures  . Basic metabolic panel    Standing Status:    Standing    Number of Occurrences:   1    Results for orders placed or performed during the hospital encounter of 06/17/20 (from the past 24 hour(s))  Basic metabolic panel     Status: Abnormal   Collection Time: 06/17/20  5:38 PM  Result Value Ref Range   Sodium 138 135 - 145 mmol/L   Potassium 3.9 3.5 - 5.1 mmol/L   Chloride 104 98 - 111 mmol/L   CO2 25 22 - 32 mmol/L   Glucose, Bld 100 (H) 70 - 99 mg/dL   BUN 17 6 - 20 mg/dL   Creatinine, Ser 1.61 0.61 - 1.24 mg/dL   Calcium 8.8 (L) 8.9 - 10.3 mg/dL   GFR, Estimated >09 >60 mL/min   Anion gap 9 5 - 15   No results found.  ED Clinical Impression  1. Acute gout of left wrist, unspecified cause   2. Essential hypertension      ED Assessment/Plan  Previous records from this facility reviewed.  As noted in HPI.  1.  Patient with a recurrence of gout.  States that it completely resolved with the prednisone, colchicine, indomethacin last time.  He denies a history of chronic kidney disease.  We will give him some prednisone, indomethacin and colchicine with a refill.  2.  Hypertension.  Patient's blood pressure has been elevated on the past 3 visits.  He has not had time to find a PMD as his wife has been admitted to the hospital with a stroke for the past month.  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  Discussed with him that he will fail his DOT physical that his blood pressure is still elevated.  He currently denies symptoms.  Checking BMP for renal function.  Will start him on some amlodipine.  Advised him to buy a blood pressure cuff, measure his blood pressure once a day, keep a log of this, return here or follow with a primary care physician of his choice in 2 weeks for medication adjustment if necessary.  He is to bring his blood pressure cuff with him to make sure that it is accurate.  Hypertensive emergency ER return precautions given.  He states that he does not need another primary care list.  BMP normal.  No renal  insufficiency.  Discussed MDM, treatment plan, and plan for follow-up with patient. Discussed sn/sx that should prompt return to the ED. patient agrees with plan.   Meds ordered this encounter  Medications  . colchicine 0.6 MG tablet    Sig: 2 tabs po x 1, then one tab po 1 hour later    Dispense:  6 tablet    Refill:  2  . predniSONE (DELTASONE) 20  MG tablet    Sig: Take 2 tablets (40 mg total) by mouth daily with breakfast for 5 days.    Dispense:  10 tablet    Refill:  0  . indomethacin (INDOCIN) 25 MG capsule    Sig: Take 2 capsules (50 mg total) by mouth 3 (three) times daily as needed for up to 5 days.    Dispense:  30 capsule    Refill:  0  . amLODipine (NORVASC) 5 MG tablet    Sig: Take 1 tablet (5 mg total) by mouth daily.    Dispense:  30 tablet    Refill:  0    *This clinic note was created using Scientist, clinical (histocompatibility and immunogenetics). Therefore, there may be occasional mistakes despite careful proofreading.   ?    Domenick Gong, MD 06/18/20 [phone]

## 2022-12-01 ENCOUNTER — Ambulatory Visit
Admission: EM | Admit: 2022-12-01 | Discharge: 2022-12-01 | Disposition: A | Discharge status: Home / Self Care | Payer: BC Managed Care – PPO | Attending: Physician Assistant | Admitting: Physician Assistant

## 2022-12-01 ENCOUNTER — Encounter: Payer: Self-pay | Admitting: Emergency Medicine

## 2022-12-01 DIAGNOSIS — M109 Gout, unspecified: Secondary | ICD-10-CM | POA: Diagnosis not present

## 2022-12-01 DIAGNOSIS — Z760 Encounter for issue of repeat prescription: Secondary | ICD-10-CM

## 2022-12-01 DIAGNOSIS — I10 Essential (primary) hypertension: Secondary | ICD-10-CM | POA: Diagnosis not present

## 2022-12-01 MED ORDER — INDOMETHACIN 50 MG PO CAPS: 50.0000 mg | ORAL_CAPSULE | Freq: Three times a day (TID) | ORAL | 0 refills | Status: DC | PRN

## 2022-12-01 MED ORDER — AMLODIPINE BESYLATE 5 MG PO TABS: 5.0000 mg | ORAL_TABLET | Freq: Every day | ORAL | 2 refills | Status: AC

## 2022-12-01 NOTE — Discharge Instructions (Signed)
-  I refilled your pressure medicine and the indomethacin in case you have a gout flare. - Follow-up with primary care provider for additional refills.

## 2022-12-01 NOTE — ED Provider Notes (Signed)
[facility]    CSN: [id number] Arrival date & time: 12/01/22  1524      History   Chief Complaint Chief Complaint  Patient presents with   Medication Refill   Hypertension    HPI Carl Washington is a 57 y.o. male with history of hypertension and gout.  He presents today for refill of blood pressure medication.  Reports that he has been taking amlodipine up until about 3 months ago.  He no longer has a PCP.  He request a refill of this medication and also indomethacin which he uses as needed for gout flares.  Denies current gout flare.  Patient says he would like to come in today to try to make an appointment but they had nothing available and he wanted to be seen sooner.  He denies checking his blood pressure at home.  He says he just knows that time.  He has not had any chest pain or breathing difficulty, headaches, vision changes, shortness of breath, leg swelling.  No other complaints.  HPI  Past Medical History:  Diagnosis Date   Personal history of gout     There are no problems to display for this patient.   Past Surgical History:  Procedure Laterality Date   HERNIA REPAIR         Home Medications    Prior to Admission medications   Medication Sig Start Date End Date Taking? Authorizing Provider  indomethacin (INDOCIN) 50 MG capsule Take 1 capsule (50 mg total) by mouth 3 (three) times daily as needed for moderate pain. 12/01/22  Yes Eusebio Friendly B, PA-C  amLODipine (NORVASC) 5 MG tablet Take 1 tablet (5 mg total) by mouth daily. 12/01/22   Shirlee Latch, PA-C    Family History Family History  Problem Relation Age of Onset   Diabetes Mother    Other Father        unknown medical history    Social History Social History   Tobacco Use   Smoking status: Every Day    Packs/day: 0.75    Years: 2.00    Additional pack years: 0.00    Total pack years: 1.50    Types: Cigarettes   Smokeless tobacco: Never  Vaping Use   Vaping Use: Never used   Substance Use Topics   Alcohol use: Yes    Comment: social   Drug use: Never     Allergies   Patient has no known allergies.   Review of Systems Review of Systems  Constitutional:  Negative for fatigue.  Eyes:  Negative for visual disturbance.  Respiratory:  Negative for shortness of breath.   Cardiovascular:  Negative for chest pain, palpitations and leg swelling.  Gastrointestinal:  Negative for nausea and vomiting.  Musculoskeletal:  Negative for arthralgias and joint swelling.  Neurological:  Negative for dizziness, weakness, numbness and headaches.     Physical Exam Triage Vital Signs ED Triage Vitals  Enc Vitals Group     BP      Pulse      Resp      Temp      Temp src      SpO2      Weight      Height      Head Circumference      Peak Flow      Pain Score      Pain Loc      Pain Edu?      Excl. in GC?  No data found.  Updated Vital Signs BP (!) 174/90 (BP Location: Left Arm)   Pulse 80   Temp 98.4 F (36.9 C) (Oral)   Resp 18   SpO2 96%     Physical Exam Vitals and nursing note reviewed.  Constitutional:      General: He is not in acute distress.    Appearance: Normal appearance. He is well-developed. He is not ill-appearing.  HENT:     Head: Normocephalic and atraumatic.  Eyes:     General: No scleral icterus.    Conjunctiva/sclera: Conjunctivae normal.  Cardiovascular:     Rate and Rhythm: Normal rate and regular rhythm.     Heart sounds: Normal heart sounds.  Pulmonary:     Effort: Pulmonary effort is normal. No respiratory distress.     Breath sounds: Normal breath sounds.  Musculoskeletal:     Cervical back: Neck supple.  Skin:    General: Skin is warm and dry.     Capillary Refill: Capillary refill takes less than 2 seconds.  Neurological:     General: No focal deficit present.     Mental Status: He is alert. Mental status is at baseline.     Motor: No weakness.     Gait: Gait normal.  Psychiatric:        Mood and  Affect: Mood normal.        Behavior: Behavior normal.      UC Treatments / Results  Labs (all labs ordered are listed, but only abnormal results are displayed) Labs Reviewed - No data to display  EKG   Radiology No results found.  Procedures Procedures (including critical care time)  Medications Ordered in UC Medications - No data to display  Initial Impression / Assessment and Plan / UC Course  I have reviewed the triage vital signs and the nursing notes.  Pertinent labs & imaging results that were available during my care of the patient were reviewed by me and considered in my medical decision making (see chart for details).   56 year old male presents for blood pressure medication refill.  Also would like refill of indomethacin which he uses as needed for gout flares.  Denies current flare-up.  Blood pressure 170s over 90s systolic.  Rechecked multiple times.  Patient believes he has a little bit of whitecoat hypertension.  The remainder of exam is normal.  Refilled amlodipine x 3 months as well as indomethacin.  Patient set up with appointment to see new PCP on 12/29/2022.  Advised to follow-up here as needed.   Final Clinical Impressions(s) / UC Diagnoses   Final diagnoses:  Essential hypertension  Medication refill  Gout, unspecified cause, unspecified chronicity, unspecified site     Discharge Instructions      -I refilled your pressure medicine and the indomethacin in case you have a gout flare. - Follow-up with primary care provider for additional refills.     ED Prescriptions     Medication Sig Dispense Auth. Provider   amLODipine (NORVASC) 5 MG tablet Take 1 tablet (5 mg total) by mouth daily. 30 tablet Eusebio Friendly B, PA-C   indomethacin (INDOCIN) 50 MG capsule Take 1 capsule (50 mg total) by mouth 3 (three) times daily as needed for moderate pain. 30 capsule Shirlee Latch, PA-C      PDMP not reviewed this encounter.   Shirlee Latch,  PA-C 12/01/22 [phone]

## 2022-12-01 NOTE — ED Triage Notes (Addendum)
Pt presents to [facility] for medication refill on his BP medication. He states he does not have a PCP. Pt unsure of what medication he was on but per the chart it is amlodipine.

## 2022-12-01 NOTE — ED Notes (Signed)
Pt notified of his new patient appt with Burtis Junes., PA with [facility] on 5/072024 at 8:40am, to arrive at 8:20, and bring insurance card.

## 2022-12-29 ENCOUNTER — Ambulatory Visit: Payer: Self-pay | Admitting: Physician Assistant

## 2023-12-06 ENCOUNTER — Ambulatory Visit
Admission: EM | Admit: 2023-12-06 | Discharge: 2023-12-06 | Disposition: A | Discharge status: Home / Self Care | Attending: Family Medicine | Admitting: Family Medicine

## 2023-12-06 DIAGNOSIS — M109 Gout, unspecified: Secondary | ICD-10-CM | POA: Diagnosis not present

## 2023-12-06 DIAGNOSIS — I10 Essential (primary) hypertension: Secondary | ICD-10-CM | POA: Diagnosis not present

## 2023-12-06 MED ORDER — COLCHICINE 0.6 MG PO TABS: ORAL_TABLET | ORAL | 0 refills | Status: DC

## 2023-12-06 MED ORDER — IBUPROFEN 800 MG PO TABS: 400.0000 mg | ORAL_TABLET | Freq: Three times a day (TID) | ORAL | 0 refills | Status: DC | PRN

## 2023-12-06 NOTE — Discharge Instructions (Addendum)
 One of your blood pressure medications called hydrochlorothiazide causes more gout flares.  You likely need to switch blood pressure medications.  When you go ask your primary care doctor about starting allopurinol for gout prevention.    Please monitor your blood pressure at home first thing in the morning prior to eating and drinking and journal this for PCP follow-up. Today your blood pressure is likely elevated as a result of your current symptoms and discomfort. If you remain persistently elevated after your symptoms improve, you may need medical management. Other recommendations for high blood pressure are to decrease the amount of salt in your diet, exercise (150 minutes of moderate intensity exercise weekly), weight loss.  You should follow up with your primary doctor in 2-3 days regarding today's urgent care visit. If your symptoms persist, you may need a additional cardiovascular evaluation.

## 2023-12-06 NOTE — ED Triage Notes (Signed)
 Pt c/o L hand pain x4 days. Hx of gout. Denies any falls or injuries.

## 2023-12-06 NOTE — ED Provider Notes (Signed)
 [facility]    CSN: [id number] Arrival date & time: 12/06/23  1104      History   Chief Complaint Chief Complaint  Patient presents with   Hand Pain    HPI  HPI Carl Washington is a 58 y.o. male.   Carl Washington presents for left hand and wrist pain with swelling that started Thursday which hasn't gotten better.  Has been taking ibuprofen without relief. Has history of gout.  Pain described as throbbing and achy pain rated 8/10.  No trauma, injury or falls.  Has been eating steak.  No seafood or alcohol use.   Hasn't taken his blood pressure medication this morning. Says he took it yesterday.       Past Medical History:  Diagnosis Date   Personal history of gout     There are no active problems to display for this patient.   Past Surgical History:  Procedure Laterality Date   HERNIA REPAIR         Home Medications    Prior to Admission medications   Medication Sig Start Date End Date Taking? Authorizing Provider  amLODipine (NORVASC) 5 MG tablet Take 1 tablet (5 mg total) by mouth daily. 12/01/22  Yes Eusebio Friendly B, PA-C  ibuprofen (ADVIL) 800 MG tablet Take 0.5 tablets (400 mg total) by mouth every 8 (eight) hours as needed. 12/06/23  Yes Carole Doner, DO  colchicine 0.6 MG tablet Take 2 tablets then an hour later take another tablet. The next day take one tablet daily until complete. 12/06/23   Katha Cabal, DO  hydrochlorothiazide (HYDRODIURIL) 12.5 MG tablet Take 12.5 mg by mouth every morning. 07/20/23   [provider]  hydrochlorothiazide (HYDRODIURIL) 25 MG tablet Take 25 mg by mouth every morning. 07/20/23   [provider]    Family History Family History  Problem Relation Age of Onset   Diabetes Mother    Other Father        unknown medical history    Social History Social History   Tobacco Use   Smoking status: Every Day    Current packs/day: 0.75    Average packs/day: 0.8 packs/day for 2.0 years (1.5 ttl  pk-yrs)    Types: Cigarettes   Smokeless tobacco: Never  Vaping Use   Vaping status: Never Used  Substance Use Topics   Alcohol use: Yes    Comment: social   Drug use: Never     Allergies   Patient has no known allergies.   Review of Systems Review of Systems: :negative unless otherwise stated in HPI.      Physical Exam Triage Vital Signs ED Triage Vitals [12/06/23 1111]  Encounter Vitals Group     BP      Systolic BP Percentile      Diastolic BP Percentile      Pulse      Resp 16     Temp      Temp Source Oral     SpO2      Weight      Height      Head Circumference      Peak Flow      Pain Score      Pain Loc      Pain Education      Exclude from Growth Chart    No data found.  Updated Vital Signs BP (!) 173/93 (BP Location: Left Arm)   Pulse 92   Temp 98.7 F (37.1 C) (Oral)  Resp 16   Ht 5\' 11"  (1.803 m)   Wt 127 kg   SpO2 94%   BMI 39.05 kg/m   Visual Acuity Right Eye Distance:   Left Eye Distance:   Bilateral Distance:    Right Eye Near:   Left Eye Near:    Bilateral Near:     Physical Exam GEN: well appearing male in no acute distress  CVS: well perfused, regular rate and rhythm RESP: speaking in full sentences without pause, no respiratory distress, clear  MSK:   Left Hand and Wrist: Inspection: No obvious deformity b/l. Moderate swelling with warm of the hand and wrist , erythema or bruising b/l Palpation: no TTP b/l WUJ:WJXB ROM of the digits and wrist though with pain. Able to fully to extend and flex finger  Strength: 5/5 strength in the forearm, wrist and interosseus muscles b/l Neurovascular: NV intact b/l Wrist: ROM good flexion and extension and ulnar/radial deviation that is not symmetrical with opposite wrist. Palpation is normal over metacarpals, scaphoid. Strength 5/5 in all directions without pain.    UC Treatments / Results  Labs (all labs ordered are listed, but only abnormal results are displayed) Labs  Reviewed - No data to display  EKG   Radiology No results found.   Procedures Procedures (including critical care time)  Medications Ordered in UC Medications - No data to display  Initial Impression / Assessment and Plan / UC Course  I have reviewed the triage vital signs and the nursing notes.  Pertinent labs & imaging results that were available during my care of the patient were reviewed by me and considered in my medical decision making (see chart for details).      Pt is a 58 y.o.  male with history of gout and HTN presents for acute gout flare up. Imaging deferred. On chart review, pt doesn't have a uric acid available for review however has had several left wrist pain episodes.  Of note he takes hydrochlorothiazide for HTN.  This can precipitate gout. Recommended he speak with his PCP about switching this medication. He would benefit from starting allopurinol as well.   Patient to gradually return to normal activities, as tolerated and continue ordinary activities within the limits permitted by pain. Prescribed colchicine and ibuprofen for pain relief.  Tylenol PRN. His BP is too elevated for steroids at this time. Advised patient to avoid OTC NSAIDs while taking prescription NSAID.  Carl Washington is hypertensive here.  BP 173/93. Pt reports not taking his medication this morning. He is prescribed amlodipine 5 mg and  HCTZ.  Denies needing refills. Recommended he check his blood pressure and follow up with his primary care provider in the next 2 weeks.   Patient to follow up with orthopedic provider, if symptoms do not improve with conservative treatment.  Return and ED precautions given. Understanding voiced. Discussed MDM, treatment plan and plan for follow-up with patient who agrees with plan.   Final Clinical Impressions(s) / UC Diagnoses   Final diagnoses:  Acute gout of multiple sites, unspecified cause  Elevated blood pressure reading with diagnosis of hypertension      Discharge Instructions      One of your blood pressure medications called hydrochlorothiazide causes more gout flares.  You likely need to switch blood pressure medications.  When you go ask your primary care doctor about starting allopurinol for gout prevention.    Please monitor your blood pressure at home first thing in the morning prior to eating and  drinking and journal this for PCP follow-up. Today your blood pressure is likely elevated as a result of your current symptoms and discomfort. If you remain persistently elevated after your symptoms improve, you may need medical management. Other recommendations for high blood pressure are to decrease the amount of salt in your diet, exercise (150 minutes of moderate intensity exercise weekly), weight loss.  You should follow up with your primary doctor in 2-3 days regarding today's urgent care visit. If your symptoms persist, you may need a additional cardiovascular evaluation.      ED Prescriptions     Medication Sig Dispense Auth. Provider   colchicine 0.6 MG tablet Take 2 tablets then an hour later take another tablet. The next day take one tablet daily until complete. 9 tablet Balthazar Dooly, DO   ibuprofen (ADVIL) 800 MG tablet Take 0.5 tablets (400 mg total) by mouth every 8 (eight) hours as needed. 30 tablet Cathy Crounse, DO      PDMP not reviewed this encounter.   Margee Trentham, DO 12/06/23 1542

## 2023-12-30 ENCOUNTER — Ambulatory Visit
Admission: EM | Admit: 2023-12-30 | Discharge: 2023-12-30 | Disposition: A | Discharge status: Home / Self Care | Attending: Family Medicine | Admitting: Family Medicine

## 2023-12-30 ENCOUNTER — Ambulatory Visit (INDEPENDENT_AMBULATORY_CARE_PROVIDER_SITE_OTHER)

## 2023-12-30 DIAGNOSIS — M109 Gout, unspecified: Secondary | ICD-10-CM

## 2023-12-30 DIAGNOSIS — I10 Essential (primary) hypertension: Secondary | ICD-10-CM

## 2023-12-30 DIAGNOSIS — D649 Anemia, unspecified: Secondary | ICD-10-CM | POA: Diagnosis present

## 2023-12-30 DIAGNOSIS — M25532 Pain in left wrist: Secondary | ICD-10-CM

## 2023-12-30 DIAGNOSIS — M79642 Pain in left hand: Secondary | ICD-10-CM | POA: Diagnosis present

## 2023-12-30 LAB — CBC WITH DIFFERENTIAL/PLATELET
Abs Immature Granulocytes: 0.05 10*3/uL (ref 0.00–0.07)
Basophils Absolute: 0 10*3/uL (ref 0.0–0.1)
Basophils Relative: 0 %
Eosinophils Absolute: 0.2 10*3/uL (ref 0.0–0.5)
Eosinophils Relative: 2 %
HCT: 37.7 % — ABNORMAL LOW (ref 39.0–52.0)
Hemoglobin: 12.7 g/dL — ABNORMAL LOW (ref 13.0–17.0)
Immature Granulocytes: 1 %
Lymphocytes Relative: 25 %
Lymphs Abs: 2.1 10*3/uL (ref 0.7–4.0)
MCH: 31.1 pg (ref 26.0–34.0)
MCHC: 33.7 g/dL (ref 30.0–36.0)
MCV: 92.2 fL (ref 80.0–100.0)
Monocytes Absolute: 0.7 10*3/uL (ref 0.1–1.0)
Monocytes Relative: 8 %
Neutro Abs: 5.6 10*3/uL (ref 1.7–7.7)
Neutrophils Relative %: 64 %
Platelets: 235 10*3/uL (ref 150–400)
RBC: 4.09 MIL/uL — ABNORMAL LOW (ref 4.22–5.81)
RDW: 12.6 % (ref 11.5–15.5)
WBC: 8.6 10*3/uL (ref 4.0–10.5)
nRBC: 0 % (ref 0.0–0.2)

## 2023-12-30 LAB — URIC ACID: Uric Acid, Serum: 6 mg/dL (ref 3.7–8.6)

## 2023-12-30 LAB — BASIC METABOLIC PANEL WITH GFR
Anion gap: 10 (ref 5–15)
BUN: 21 mg/dL — ABNORMAL HIGH (ref 6–20)
CO2: 22 mmol/L (ref 22–32)
Calcium: 8.9 mg/dL (ref 8.9–10.3)
Chloride: 107 mmol/L (ref 98–111)
Creatinine, Ser: 0.98 mg/dL (ref 0.61–1.24)
GFR, Estimated: >60 mL/min (ref >60)
Glucose, Bld: 96 mg/dL (ref 70–99)
Potassium: 3.6 mmol/L (ref 3.5–5.1)
Sodium: 139 mmol/L (ref 135–145)

## 2023-12-30 MED ORDER — LOSARTAN POTASSIUM 50 MG PO TABS: 50.0000 mg | ORAL_TABLET | Freq: Every day | ORAL | 2 refills | Status: AC

## 2023-12-30 MED ORDER — TRAMADOL HCL 50 MG PO TABS: 50.0000 mg | ORAL_TABLET | Freq: Four times a day (QID) | ORAL | 0 refills | Status: AC | PRN

## 2023-12-30 MED ORDER — PREDNISONE 10 MG (21) PO TBPK: ORAL_TABLET | Freq: Every day | ORAL | 0 refills | Status: DC

## 2023-12-30 MED ORDER — DEXAMETHASONE SODIUM PHOSPHATE 10 MG/ML IJ SOLN
10.0000 mg | Freq: Once | INTRAMUSCULAR | Status: AC
  Administered 2023-12-30: 10 mg via INTRAMUSCULAR

## 2023-12-30 NOTE — ED Provider Notes (Signed)
 [facility]    CSN: [id number] Arrival date & time: 12/30/23  1709      History   Chief Complaint Chief Complaint  Patient presents with   Hand Pain    HPI  HPI Carl Washington is a 58 y.o. male.   Carl Washington presents for persisting left hand and wrist pain pain for the 3 weeks.  He was seen here and also at another urgent care and provided treatment which helped but didn't go completely away.  Continues to have difficulty moving his wrist and hand.  There is swelling here as well.  Notes history of gout.  Has been drinking shots of liquor to help with the pain. Has been eating fish too. No wine, beer or red meat.  Has been taking a lot of ibuprofen  over the last few weeks.     Past Medical History:  Diagnosis Date   Personal history of gout     There are no active problems to display for this patient.   Past Surgical History:  Procedure Laterality Date   HERNIA REPAIR         Home Medications    Prior to Admission medications   Medication Sig Start Date End Date Taking? Authorizing Provider  amLODipine  (NORVASC ) 5 MG tablet Take 1 tablet (5 mg total) by mouth daily. 12/01/22  Yes Nancy Axon B, PA-C  losartan (COZAAR) 50 MG tablet Take 1 tablet (50 mg total) by mouth daily. 12/30/23  Yes Mearl Harewood, DO  predniSONE  (STERAPRED UNI-PAK 21 TAB) 10 MG (21) TBPK tablet Take by mouth daily. Take 6 tabs by mouth daily for 1, then 5 tabs for 1 day, then 4 tabs for 1 day, then 3 tabs for 1 day, then 2 tabs for 1 day, then 1 tab for 1 day. 12/30/23  Yes Brendt Dible, DO  traMADol (ULTRAM) 50 MG tablet Take 1 tablet (50 mg total) by mouth every 6 (six) hours as needed. 12/30/23  Yes Fidel Huddle, DO    Family History Family History  Problem Relation Age of Onset   Diabetes Mother    Other Father        unknown medical history    Social History Social History   Tobacco Use   Smoking status: Some Days    Current packs/day: 0.75    Average packs/day:  0.8 packs/day for 2.0 years (1.5 ttl pk-yrs)    Types: Cigars, Cigarettes   Smokeless tobacco: Never  Vaping Use   Vaping status: Never Used  Substance Use Topics   Alcohol use: Yes    Comment: social   Drug use: Never     Allergies   Patient has no known allergies.   Review of Systems Review of Systems: :negative unless otherwise stated in HPI.      Physical Exam Triage Vital Signs ED Triage Vitals [12/30/23 1734]  Encounter Vitals Group     BP (!) 167/89     Systolic BP Percentile      Diastolic BP Percentile      Pulse Rate 85     Resp 16     Temp (!) 97.4 F (36.3 C)     Temp Source Oral     SpO2 93 %     Weight      Height      Head Circumference      Peak Flow      Pain Score 9     Pain Loc      Pain  Education      Exclude from Growth Chart    No data found.  Updated Vital Signs BP (!) 153/92 (BP Location: Right Arm)   Pulse 85   Temp (!) 97.4 F (36.3 C) (Oral)   Resp 16   SpO2 93%   Visual Acuity Right Eye Distance:   Left Eye Distance:   Bilateral Distance:    Right Eye Near:   Left Eye Near:    Bilateral Near:     Physical Exam GEN: non-toxic appearing male in no acute distress  CVS: well perfused, regular rate and rhythm, strong radial pulse on left RESP: speaking in full sentences without pause, no respiratory distress, clear  MSK:  Left Hand and Wrist: Inspection: No obvious deformity b/l. Moderate swelling with warm of the hand and wrist, +erythema.  No ecchymosis Palpation: Wrist and distal metacarpals tender to palpation, no scaphoid tenderness, no olecranon tenderness ROM: Limited ROM of the digits and wrist. Able to fully to extend and flex each finger  Strength: 5/5 strength in the forearm, and interosseus muscles b/l Neurovascular: NV intact b/l   UC Treatments / Results  Labs (all labs ordered are listed, but only abnormal results are displayed) Labs Reviewed  BASIC METABOLIC PANEL WITH GFR - Abnormal; Notable for the  following components:      Result Value   BUN 21 (*)    All other components within normal limits  CBC WITH DIFFERENTIAL/PLATELET - Abnormal; Notable for the following components:   RBC 4.09 (*)    Hemoglobin 12.7 (*)    HCT 37.7 (*)    All other components within normal limits  URIC ACID    EKG   Radiology DG Wrist Complete Left Result Date: 12/30/2023 CLINICAL DATA:  Pain, swelling EXAM: LEFT WRIST - COMPLETE 3+ VIEW COMPARISON:  None Available. FINDINGS: Diffuse soft tissue swelling. No acute bony abnormality. Specifically, no fracture, subluxation, or dislocation. IMPRESSION: Diffuse soft tissue swelling.  No acute bony abnormality. Electronically Signed   By: Janeece Mechanic M.D.   On: 12/30/2023 18:55   DG Hand Complete Left Result Date: 12/30/2023 CLINICAL DATA:  Pain, swelling EXAM: LEFT HAND - COMPLETE 3+ VIEW COMPARISON:  Wrist series today FINDINGS: Soft tissue swelling diffusely. No acute bony abnormality. Specifically, no fracture, subluxation, or dislocation. Joint spaces maintained. No radiopaque foreign bodies. IMPRESSION: Diffuse soft tissue swelling.  No acute bony abnormality. Electronically Signed   By: Janeece Mechanic M.D.   On: 12/30/2023 18:54     Procedures Procedures (including critical care time)  Medications Ordered in UC Medications  dexamethasone (DECADRON) injection 10 mg (10 mg Intramuscular Given 12/30/23 1906)    Initial Impression / Assessment and Plan / UC Course  I have reviewed the triage vital signs and the nursing notes.  Pertinent labs & imaging results that were available during my care of the patient were reviewed by me and considered in my medical decision making (see chart for details).      Pt is a 58 y.o.  male with history of gout and HTN presents for ongoing left wrist and hand pain and swelling. Imaging not obtained during his 12/06/2023 visit therefore we will obtain imaging today to evaluate for fracture or dislocated bones.  Obtain CBC  to evaluate for possible septic arthritis.  Obtaining new uric acid level.  On chart review, pt doesn't have a uric acid available for review however has had several left wrist pain episodes which were thought to be gout related.  Of note he previously took hydrochlorothiazide for HTN which can precipitate gout.  States that he is no longer taking this medication.  At his urgent care visit on 12/17/2023 he was treated with steroids and prescribed allopurinol.    Wrist and hand plain films personally reviewed by me showing no acute fracture or dislocation.  Radiologist impression reviewed.  CBC showing no leukocytosis therefore doubt septic arthritis.  Uric acid level is at 6.  Given that he has been drinking liquor I suspect that he is still exacerbating a gout flareup.  He was given Decadron 10 mg IM here and prescribed prednisone  taper to start tomorrow.  Short course of tramadol for pain as he been taking ibuprofen  without relief. Patient to gradually return to normal activities, as tolerated and continue ordinary activities within the limits permitted by pain. Tylenol  PRN.   Carl Washington is hypertensive here.  BP 167/89.  After sitting his blood pressure was 153/92.  Pt reports taking his 10 mg amlodipine  today.  Per chart review, he is prescribed amlodipine  5 mg.  He has stopped taking the hydrochlorothiazide.  This medication was not replaced.  BMP obtained and showing normal serum creatinine.  Start losartan 50 mg daily. Recommended he check his blood pressure daily and follow up with his primary care provider in the next 2 weeks for repeat BMP and medication titration.   An incidental finding on his CBC was that he has a mild anemia.  He has been taking in lots of NSAIDs.  Advised patient to stop this.  I do not see where he has had a colonoscopy.  He will follow-up with his primary care doctor to discuss getting a colonoscopy.   Return and ED precautions given. Understanding voiced. Discussed MDM,  treatment plan and plan for follow-up with patient who agrees with plan.  Final Clinical Impressions(s) / UC Diagnoses   Final diagnoses:  Hand pain, left  Wrist pain, acute, left  Elevated blood pressure reading with diagnosis of hypertension  Acute gout of multiple sites, unspecified cause  Normocytic anemia     Discharge Instructions      You had evidence of anemia on your blood work.  Follow-up with your primary care doctor to discuss getting a colonoscopy. Stop taking the ibuprofen .   Your kidney function is normal.  For your wrist and hand pain and swelling: Your hand and wrist x-rays did not show any broken or dislocated bones.  They did show a lot of swelling. Start taking the prednisone  10 mg daily. Hold your allopurinol until you are out of your gout flare and then restart it.   High blood pressure: Continue taking your amlodipine . We started a new blood pressure medication today called losartan. Now, you will take 2 blood pressure medications.    ED Prescriptions     Medication Sig Dispense Auth. Provider   losartan (COZAAR) 50 MG tablet Take 1 tablet (50 mg total) by mouth daily. 30 tablet Jalaysha Skilton, DO   predniSONE  (STERAPRED UNI-PAK 21 TAB) 10 MG (21) TBPK tablet Take by mouth daily. Take 6 tabs by mouth daily for 1, then 5 tabs for 1 day, then 4 tabs for 1 day, then 3 tabs for 1 day, then 2 tabs for 1 day, then 1 tab for 1 day. 21 tablet Natacha Jepsen, DO   traMADol (ULTRAM) 50 MG tablet Take 1 tablet (50 mg total) by mouth every 6 (six) hours as needed. 10 tablet Rayn Shorb, DO  I have reviewed the PDMP during this encounter.   Tianna Baus, DO 12/30/23 2155

## 2023-12-30 NOTE — ED Triage Notes (Signed)
 Pt states he is still having pain and swelling in his left hand. Pt was seen on 12/06/23 and prescribed medication for gout but states it is not any better, still swollen and painful.

## 2023-12-30 NOTE — Discharge Instructions (Addendum)
 You had evidence of anemia on your blood work.  Follow-up with your primary care doctor to discuss getting a colonoscopy. Stop taking the ibuprofen .   Your kidney function is normal.  For your wrist and hand pain and swelling: Your hand and wrist x-rays did not show any broken or dislocated bones.  They did show a lot of swelling. Start taking the prednisone  10 mg daily. Hold your allopurinol until you are out of your gout flare and then restart it.   High blood pressure: Continue taking your amlodipine . We started a new blood pressure medication today called losartan. Now, you will take 2 blood pressure medications.

## 2024-01-20 ENCOUNTER — Ambulatory Visit: Admission: EM | Admit: 2024-01-20 | Discharge: 2024-01-20 | Disposition: A | Discharge status: Home / Self Care

## 2024-01-20 DIAGNOSIS — M109 Gout, unspecified: Secondary | ICD-10-CM | POA: Diagnosis not present

## 2024-01-20 MED ORDER — COLCHICINE 0.6 MG PO TABS: 0.6000 mg | ORAL_TABLET | Freq: Every day | ORAL | 2 refills | Status: AC

## 2024-01-20 MED ORDER — DICLOFENAC SODIUM 75 MG PO TBEC: 75.0000 mg | DELAYED_RELEASE_TABLET | Freq: Two times a day (BID) | ORAL | 0 refills | Status: AC

## 2024-01-20 MED ORDER — PREDNISONE 20 MG PO TABS: ORAL_TABLET | ORAL | 0 refills | Status: AC

## 2024-01-20 NOTE — ED Provider Notes (Signed)
 [facility]  Note:  This document was prepared using Conservation officer, historic buildings and may include unintentional dictation errors.  MRN: [id number] DOB: 1965-10-21  Subjective:   Carl Washington is a 58 y.o. male presenting for left wrist pain ongoing for approximately 3 weeks.  Patient reports history of gout arthritis and was seen here in urgent care and provided steroids and pain medication.  Patient states that hand pain and inflammation improved significantly but he is still having left wrist pain and swelling with slight tingling to the middle of his hand.  Denies any known injury or trauma.  No current facility-administered medications for this encounter.  Current Outpatient Medications:    amLODipine  (NORVASC ) 5 MG tablet, Take 1 tablet (5 mg total) by mouth daily., Disp: 30 tablet, Rfl: 2   colchicine  0.6 MG tablet, Take 1 tablet (0.6 mg total) by mouth daily., Disp: 30 tablet, Rfl: 2   diclofenac  (VOLTAREN ) 75 MG EC tablet, Take 1 tablet (75 mg total) by mouth 2 (two) times daily., Disp: 30 tablet, Rfl: 0   losartan  (COZAAR ) 50 MG tablet, Take 1 tablet (50 mg total) by mouth daily., Disp: 30 tablet, Rfl: 2   predniSONE  (DELTASONE ) 20 MG tablet, Take 3 tablets (60 mg total) by mouth daily for 3 days, THEN 2 tablets (40 mg total) daily for 4 days, THEN 1 tablet (20 mg total) daily for 3 days., Disp: 20 tablet, Rfl: 0   traMADol  (ULTRAM ) 50 MG tablet, Take 1 tablet (50 mg total) by mouth every 6 (six) hours as needed., Disp: 10 tablet, Rfl: 0   No Known Allergies  Past Medical History:  Diagnosis Date   Personal history of gout      Past Surgical History:  Procedure Laterality Date   HERNIA REPAIR      Family History  Problem Relation Age of Onset   Diabetes Mother    Other Father        unknown medical history    Social History   Tobacco Use   Smoking status: Some Days    Current packs/day: 0.75    Average packs/day: 0.8 packs/day for 2.0 years (1.5  ttl pk-yrs)    Types: Cigars, Cigarettes   Smokeless tobacco: Never  Vaping Use   Vaping status: Never Used  Substance Use Topics   Alcohol use: Yes    Comment: social   Drug use: Never    ROS Refer to HPI for ROS details.  Objective:   Vitals: BP (!) 195/93 (BP Location: Left Arm)   Pulse 73   Temp 98.1 F (36.7 C) (Oral)   Ht 5\' 11"  (1.803 m)   Wt 280 lb (127 kg)   SpO2 100%   BMI 39.05 kg/m   Physical Exam Vitals and nursing note reviewed.  Constitutional:      General: He is not in acute distress.    Appearance: Normal appearance. He is well-developed. He is not ill-appearing or toxic-appearing.  HENT:     Head: Normocephalic and atraumatic.  Cardiovascular:     Rate and Rhythm: Normal rate.  Pulmonary:     Effort: Pulmonary effort is normal. No respiratory distress.  Musculoskeletal:     Left wrist: Swelling, tenderness and bony tenderness present. No snuff box tenderness or crepitus. Decreased range of motion. Normal pulse.  Skin:    General: Skin is warm and dry.     Capillary Refill: Capillary refill takes less than 2 seconds.  Neurological:     General:  No focal deficit present.     Mental Status: He is alert and oriented to person, place, and time.  Psychiatric:        Mood and Affect: Mood normal.        Behavior: Behavior normal.     Procedures  No results found for this or any previous visit (from the past 24 hours).  Assessment and Plan :     Discharge Instructions       1. Gouty arthritis of left wrist (Primary) - Apply Wrist brace in UC for protection of left wrist until follow-up with orthopedics or symptom resolution - predniSONE  (DELTASONE ) 20 MG tablet; Take 3 tablets (60 mg total) by mouth daily for 3 days, THEN 2 tablets (40 mg total) daily for 4 days, THEN 1 tablet (20 mg total) daily for 3 days.  Dispense: 20 tablet; Refill: 0 - colchicine  0.6 MG tablet; Take 1 tablet (0.6 mg total) by mouth daily.  Dispense: 30 tablet;  Refill: 2 - diclofenac (VOLTAREN) 75 MG EC tablet; Take 1 tablet (75 mg total) by mouth 2 (two) times daily for wrist inflammation and pain. -Do not take diclofenac until after finishing steroid Dosepak, use of both medications at the same time may cause gastritis. - AMB referral to sports medicine for follow-up evaluation if symptoms do not subside with current medication regimen -Continue to monitor symptoms for any change in severity if there is any escalation of current symptoms or development of new symptoms follow-up in ER for further evaluation and management.    Clifford Coudriet B [place]   Kory Rains, [place], [place] 01/20/24 1526

## 2024-01-20 NOTE — Discharge Instructions (Addendum)
  1. Gouty arthritis of left wrist (Primary) - Apply Wrist brace in UC for protection of left wrist until follow-up with orthopedics or symptom resolution - predniSONE  (DELTASONE ) 20 MG tablet; Take 3 tablets (60 mg total) by mouth daily for 3 days, THEN 2 tablets (40 mg total) daily for 4 days, THEN 1 tablet (20 mg total) daily for 3 days.  Dispense: 20 tablet; Refill: 0 - colchicine  0.6 MG tablet; Take 1 tablet (0.6 mg total) by mouth daily.  Dispense: 30 tablet; Refill: 2 - diclofenac (VOLTAREN) 75 MG EC tablet; Take 1 tablet (75 mg total) by mouth 2 (two) times daily for wrist inflammation and pain. -Do not take diclofenac until after finishing steroid Dosepak, use of both medications at the same time may cause gastritis. - AMB referral to sports medicine for follow-up evaluation if symptoms do not subside with current medication regimen -Continue to monitor symptoms for any change in severity if there is any escalation of current symptoms or development of new symptoms follow-up in ER for further evaluation and management.

## 2024-01-20 NOTE — ED Triage Notes (Signed)
 Pt c/o continued gout in the left wrist  Pt states that he was seen here 2 weeks ago for gout and it has gotten worse  Pt states that he has problems grasping things and has left wrist and hand swelling.

## 2024-03-27 ENCOUNTER — Encounter: Payer: Self-pay | Admitting: Internal Medicine

## 2024-03-27 ENCOUNTER — Telehealth: Payer: Self-pay | Admitting: Internal Medicine

## 2024-03-27 ENCOUNTER — Ambulatory Visit: Payer: Self-pay | Admitting: Internal Medicine

## 2024-03-27 VITALS — BP 165/78 | HR 81 | Temp 98.4°F | Resp 16 | Ht 71.0 in | Wt 285.0 lb

## 2024-03-27 DIAGNOSIS — I270 Primary pulmonary hypertension: Secondary | ICD-10-CM

## 2024-03-27 DIAGNOSIS — G4719 Other hypersomnia: Secondary | ICD-10-CM

## 2024-03-27 DIAGNOSIS — F17200 Nicotine dependence, unspecified, uncomplicated: Secondary | ICD-10-CM | POA: Diagnosis not present

## 2024-03-27 NOTE — Telephone Encounter (Signed)
 Awaiting 03/27/24 office notes for SS order-Toni

## 2024-03-27 NOTE — Patient Instructions (Signed)

## 2024-03-27 NOTE — Progress Notes (Signed)
 [facility] [address] 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 Patient states he is not sure if he snores. He states he wakes up fresh. He states he does fall asleep if he is watching TV. He has not fallen asleep when driving. 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 He denies having headaches also  Office Spirometry Results:     ROS  General: (-) fever, (-) chills, (-) night sweats, (-) weakness Skin: (-) rashes, (-) itching,. Eyes: (-) visual changes, (-) redness, (-) itching. Nose and Sinuses: (-) nasal stuffiness or itchiness, (-) postnasal drip, (-) nosebleeds, (-) sinus trouble. Mouth and Throat: (-) sore throat, (-) hoarseness. Neck: (-) swollen glands, (-) enlarged thyroid, (-) neck pain. Respiratory: - cough, (-) bloody sputum, - shortness of breath, - wheezing. Cardiovascular: - ankle swelling, (-) chest pain. Lymphatic: (-) lymph node enlargement. Neurologic: (-) numbness, (-) tingling. Psychiatric: (-) anxiety, (-) depression   Current Medication: Outpatient Encounter Medications as of 03/27/2024  Medication Sig   amLODipine  (NORVASC ) 5 MG tablet Take 1 tablet (5 mg total) by mouth daily.   colchicine  0.6 MG tablet Take 1 tablet (0.6 mg total) by mouth daily.   diclofenac  (VOLTAREN ) 75 MG EC tablet Take 1 tablet (75 mg total) by mouth 2 (two) times daily.   losartan  (COZAAR ) 50 MG tablet Take 1 tablet (50 mg total) by mouth daily.   traMADol  (ULTRAM ) 50 MG tablet Take 1 tablet (50 mg total) by mouth every 6 (six) hours as needed.   No facility-administered encounter medications on file as of 03/27/2024.    Surgical History: Past Surgical History:  Procedure Laterality Date   HERNIA REPAIR      Medical  History: Past Medical History:  Diagnosis Date   Personal history of gout     Family History: Family History  Problem Relation Age of Onset   Diabetes Mother    Other Father        unknown medical history    Social History: Social History   Socioeconomic History   Marital status: Married    Spouse name: Not on file   Number of children: Not on file   Years of education: Not on file   Highest education level: Not on file  Occupational History   Not on file  Tobacco Use   Smoking status: Every Day    Current packs/day: 0.75    Average packs/day: 0.8 packs/day for 2.0 years (1.5 ttl pk-yrs)    Types: Cigars, Cigarettes   Smokeless tobacco: Never   Tobacco comments:    A couple of cigarettes daily  Vaping Use   Vaping status: Never Used  Substance and Sexual Activity   Alcohol use: Yes    Comment: social   Drug use: Never   Sexual activity: Not on file  Other Topics Concern   Not on file  Social History Narrative   Not on file   Social Drivers of Health   Financial Resource Strain: Not on file  Food Insecurity: Not on file  Transportation Needs: Not on file  Physical Activity: Not on file  Stress: Not on file  Social Connections: Not on file  Intimate Partner Violence: Not on file    Vital Signs: Blood  pressure (!) 165/78, pulse 81, temperature 98.4 F (36.9 C), resp. rate 16, height 5' 11 (1.803 m), weight 285 lb (129.3 kg), SpO2 97%.  Examination: General Appearance: The patient is well-developed, well-nourished, and in no distress. Skin: Gross inspection of skin unremarkable. Head: normocephalic, no gross deformities. Eyes: no gross deformities noted. ENT: ears appear grossly normal no exudates. Neck: Supple. No thyromegaly. No LAD. Respiratory: no rhonchi noted. Cardiovascular: Normal S1 and S2 without murmur or rub. Extremities: No cyanosis. pulses are equal. Neurologic: Alert and oriented. No involuntary movements.  LABS: Recent Results  (from the past 2160 hours)  Basic metabolic panel     Status: Abnormal   Collection Time: 12/30/23  6:28 PM  Result Value Ref Range   Sodium 139 135 - 145 mmol/L   Potassium 3.6 3.5 - 5.1 mmol/L   Chloride 107 98 - 111 mmol/L   CO2 22 22 - 32 mmol/L   Glucose, Bld 96 70 - 99 mg/dL    Comment: Glucose reference range applies only to samples taken after fasting for at least 8 hours.   BUN 21 (H) 6 - 20 mg/dL   Creatinine, Ser 9.01 0.61 - 1.24 mg/dL   Calcium 8.9 8.9 - 89.6 mg/dL   GFR, Estimated >39 >39 mL/min    Comment: (NOTE) Calculated using the CKD-EPI Creatinine Equation (2021)    Anion gap 10 5 - 15    Comment: Performed at [facility], [address], [place], [place] [address]  CBC with Differential     Status: Abnormal   Collection Time: 12/30/23  6:28 PM  Result Value Ref Range   WBC 8.6 4.0 - 10.5 K/uL   RBC 4.09 (L) 4.22 - 5.81 MIL/uL   Hemoglobin 12.7 (L) 13.0 - 17.0 g/dL   HCT 62.2 (L) 60.9 - 47.9 %   MCV 92.2 80.0 - 100.0 fL   MCH 31.1 26.0 - 34.0 pg   MCHC 33.7 30.0 - 36.0 g/dL   RDW 87.3 88.4 - 84.4 %   Platelets 235 150 - 400 K/uL   nRBC 0.0 0.0 - 0.2 %   Neutrophils Relative % 64 %   Neutro Abs 5.6 1.7 - 7.7 K/uL   Lymphocytes Relative 25 %   Lymphs Abs 2.1 0.7 - 4.0 K/uL   Monocytes Relative 8 %   Monocytes Absolute 0.7 0.1 - 1.0 K/uL   Eosinophils Relative 2 %   Eosinophils Absolute 0.2 0.0 - 0.5 K/uL   Basophils Relative 0 %   Basophils Absolute 0.0 0.0 - 0.1 K/uL   Immature Granulocytes 1 %   Abs Immature Granulocytes 0.05 0.00 - 0.07 K/uL    Comment: Performed at [facility], [address], [place], [place] [address]  Uric acid     Status: None   Collection Time: 12/30/23  6:28 PM  Result Value Ref Range   Uric Acid, Serum 6.0 3.7 - 8.6 mg/dL    Comment: Performed at [facility], [address] [place], [place] [address]    Radiology: No results found.  No results found.  No results  found.  Assessment and Plan: There are no active problems to display for this patient.   1. 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  Will go ahead and get him scheduled for the PSG now.  He is encouraged to lose weight.  Also  warned him on dangers of driving while feeling sleepy sleepy.  He is aware and understands. - PSG Sleep Study; Future  2. Smoker Patient is a smoker spoke to him at length about smoking cessation.  Important for him to quit smoking.  3. Pulmonary hypertension, primary (HCC) Pulmonary hypertension likely secondary to underlying possibility of obstructive sleep apnea will get an in-lab sleep study and may need further workup with echocardiogram etc.  General Counseling: I have discussed the findings of the evaluation and examination with Johnie.  I have also discussed any further diagnostic evaluation thatmay be needed or ordered today. Juriel verbalizes understanding of the findings of todays visit. We also reviewed his medications today and discussed drug interactions and side effects including but not limited excessive drowsiness and altered mental states. We also discussed that there is always a risk not just to him but also people around him. he has been encouraged to call the office with any questions or concerns that should arise related to todays visit.  No orders of the defined types were placed in this encounter.    Time spent: 70  I have personally obtained a history, examined the patient, evaluated laboratory and imaging results, formulated the assessment and plan and placed orders.    Elfreda DELENA Bathe, MD [facility] Pulmonary and Critical Care Sleep medicine

## 2024-04-19 ENCOUNTER — Telehealth: Payer: Self-pay | Admitting: Internal Medicine

## 2024-04-19 NOTE — Telephone Encounter (Signed)
 SS order emailed to [facility]

## 2024-06-26 ENCOUNTER — Ambulatory Visit: Payer: Self-pay | Admitting: Internal Medicine
